# Patient Record
Sex: Male | Born: 1960
Health system: Southern US, Community
[De-identification: ages and names within clinical notes are randomized; demographics above are authoritative.]

## PROBLEM LIST (undated history)

## (undated) DIAGNOSIS — E785 Hyperlipidemia, unspecified: Secondary | ICD-10-CM

## (undated) DIAGNOSIS — G4730 Sleep apnea, unspecified: Secondary | ICD-10-CM

## (undated) DIAGNOSIS — D229 Melanocytic nevi, unspecified: Secondary | ICD-10-CM

## (undated) DIAGNOSIS — I10 Essential (primary) hypertension: Secondary | ICD-10-CM

## (undated) DIAGNOSIS — R0681 Apnea, not elsewhere classified: Secondary | ICD-10-CM

## (undated) HISTORY — DX: Apnea, not elsewhere classified: R06.81

## (undated) HISTORY — DX: Essential (primary) hypertension: I10

## (undated) HISTORY — DX: Hyperlipidemia, unspecified: E78.5

## (undated) HISTORY — DX: Sleep apnea, unspecified: G47.30

---
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
## 2000-07-27 ENCOUNTER — Encounter: Payer: Self-pay | Admitting: Emergency Medicine

## 2000-07-27 ENCOUNTER — Inpatient Hospital Stay (HOSPITAL_COMMUNITY): Admission: EM | Admit: 2000-07-27 | Discharge: 2000-07-28 | Payer: Self-pay | Admitting: Emergency Medicine

## 2000-07-28 ENCOUNTER — Encounter: Payer: Self-pay | Admitting: Internal Medicine

## 2000-08-27 ENCOUNTER — Ambulatory Visit (HOSPITAL_COMMUNITY): Admission: RE | Admit: 2000-08-27 | Discharge: 2000-08-27 | Payer: Self-pay | Admitting: Cardiology

## 2000-09-06 ENCOUNTER — Ambulatory Visit (HOSPITAL_COMMUNITY): Admission: RE | Admit: 2000-09-06 | Discharge: 2000-09-06 | Payer: Self-pay | Admitting: Internal Medicine

## 2002-09-14 ENCOUNTER — Encounter: Payer: Self-pay | Admitting: Specialist

## 2002-09-15 ENCOUNTER — Observation Stay (HOSPITAL_COMMUNITY): Admission: RE | Admit: 2002-09-15 | Discharge: 2002-09-16 | Payer: Self-pay | Admitting: Specialist

## 2003-03-02 ENCOUNTER — Ambulatory Visit (HOSPITAL_COMMUNITY): Admission: RE | Admit: 2003-03-02 | Discharge: 2003-03-03 | Payer: Self-pay | Admitting: Orthopedic Surgery

## 2003-03-02 ENCOUNTER — Encounter: Payer: Self-pay | Admitting: Orthopedic Surgery

## 2003-03-02 ENCOUNTER — Encounter (INDEPENDENT_AMBULATORY_CARE_PROVIDER_SITE_OTHER): Payer: Self-pay | Admitting: Specialist

## 2004-02-28 ENCOUNTER — Ambulatory Visit (HOSPITAL_COMMUNITY): Admission: RE | Admit: 2004-02-28 | Discharge: 2004-02-28 | Payer: Self-pay | Admitting: Orthopedic Surgery

## 2004-07-31 ENCOUNTER — Ambulatory Visit (HOSPITAL_BASED_OUTPATIENT_CLINIC_OR_DEPARTMENT_OTHER): Admission: RE | Admit: 2004-07-31 | Discharge: 2004-07-31 | Payer: Self-pay | Admitting: Orthopedic Surgery

## 2004-07-31 ENCOUNTER — Ambulatory Visit (HOSPITAL_COMMUNITY): Admission: RE | Admit: 2004-07-31 | Discharge: 2004-07-31 | Payer: Self-pay | Admitting: Orthopedic Surgery

## 2004-12-15 ENCOUNTER — Encounter: Admission: RE | Admit: 2004-12-15 | Discharge: 2004-12-15 | Payer: Self-pay | Admitting: Internal Medicine

## 2004-12-15 IMAGING — US US SCROTUM
1 series · 14 of 25 positions shown · non-contrast
Comparison: None.

CLINICAL DATA: Left testicle larger than the right on physical exam. 
SCROTAL ULTRASOUND ? [DATE]:

[Series 1: unknown · 0.09mm/px · 14 of 66 slices shown]
[im 1/66]
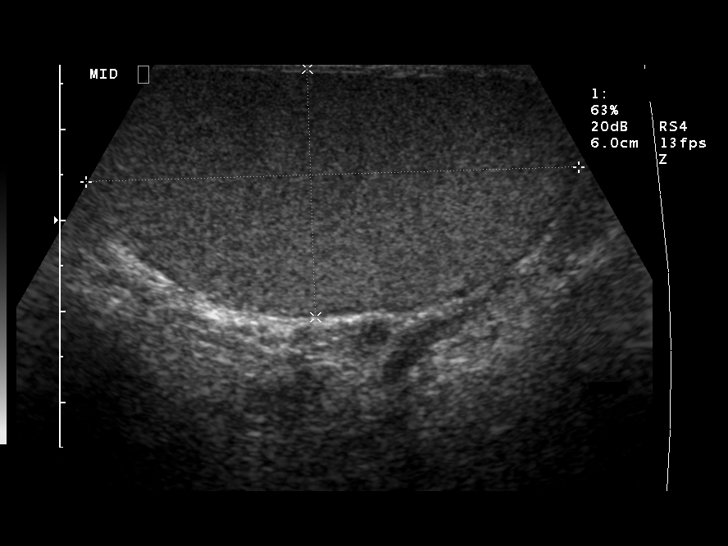
[im 6/66]
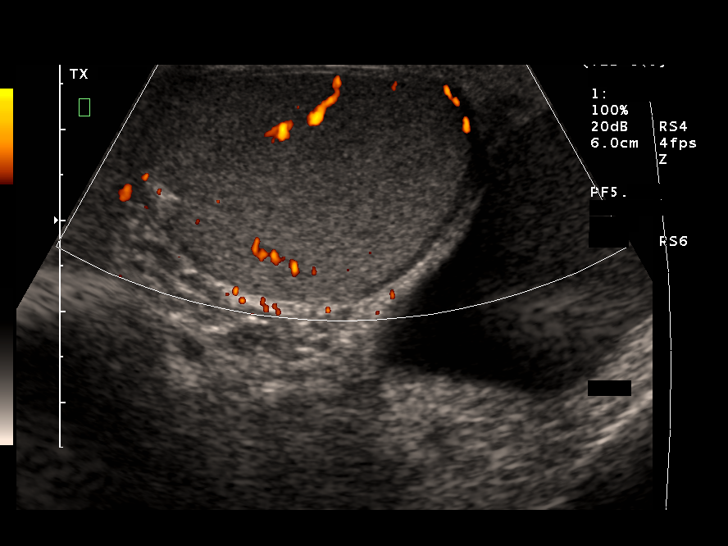
[im 11/66]
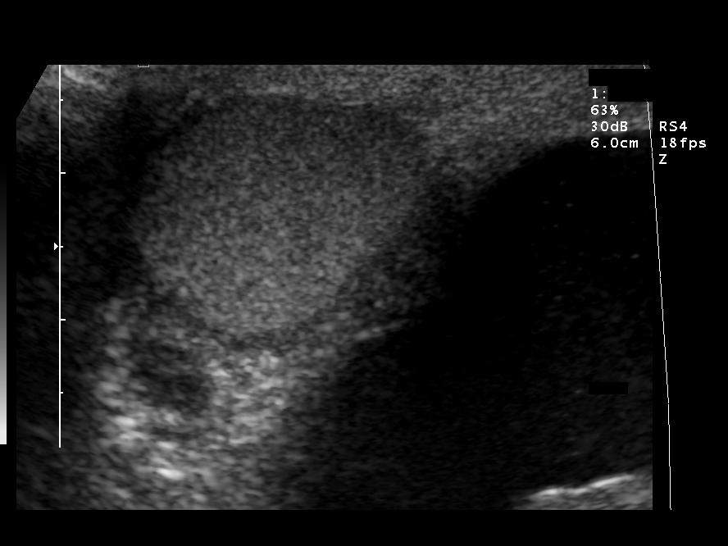
[im 17/66]
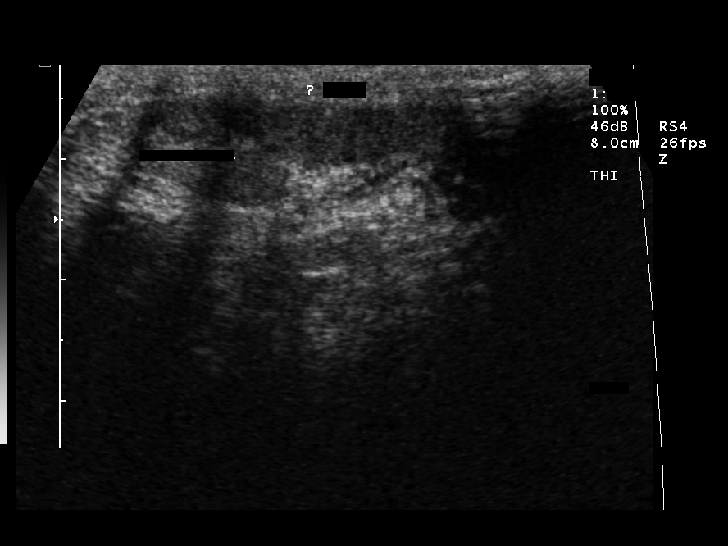
[im 22/66]
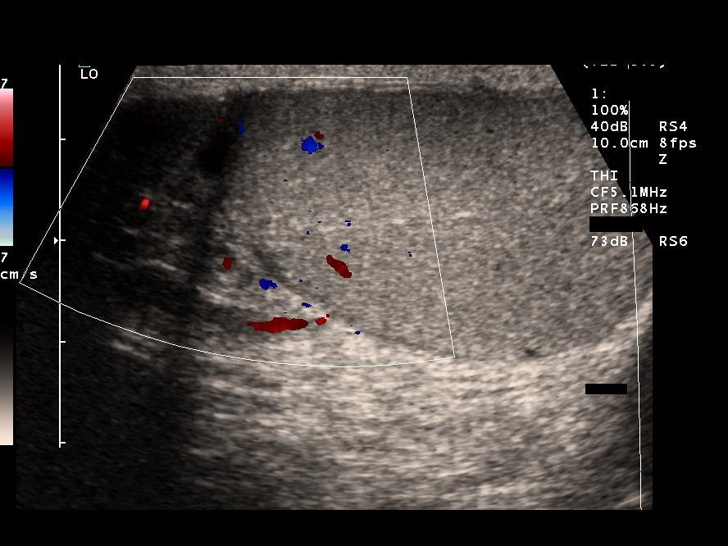
[im 25/66]
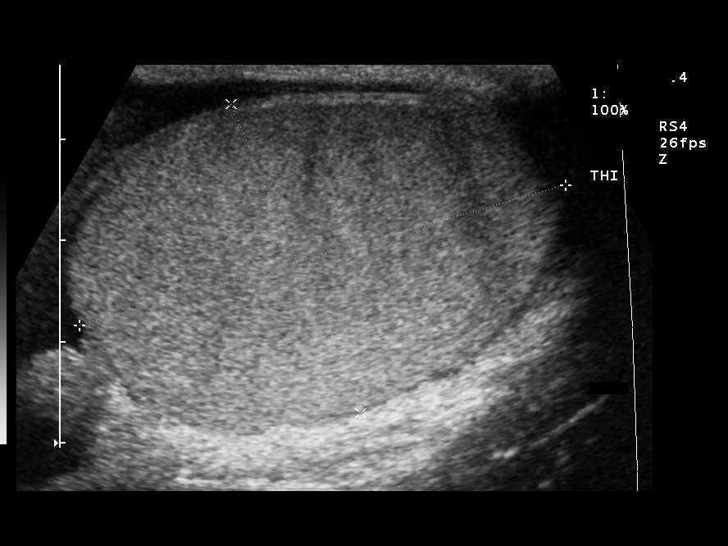
[im 30/66]
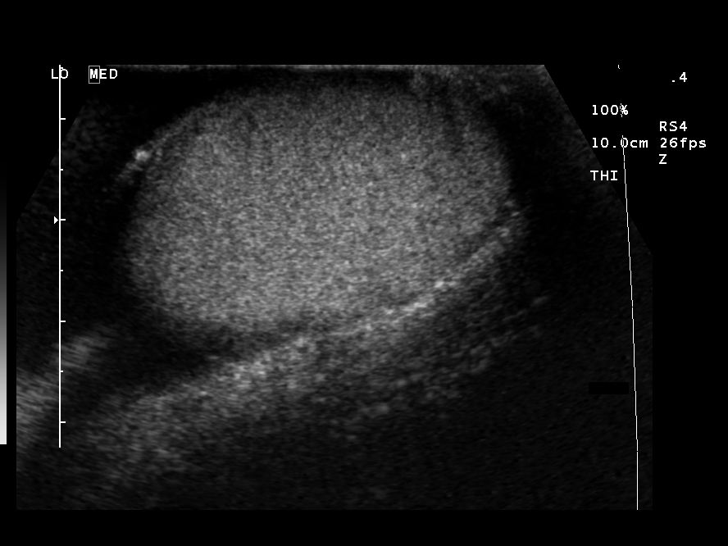
[im 36/66]
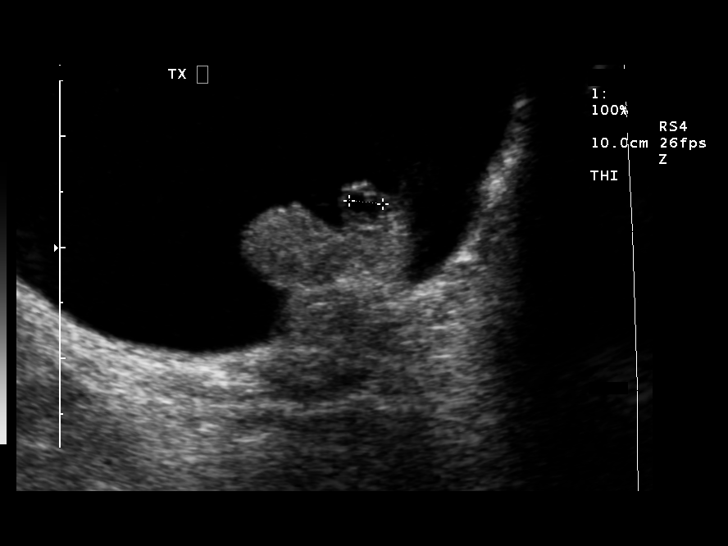
[im 41/66]
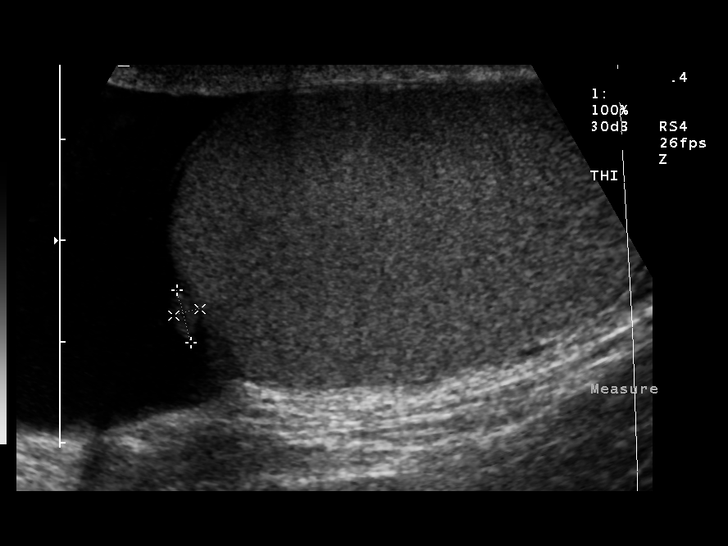
[im 44/66]
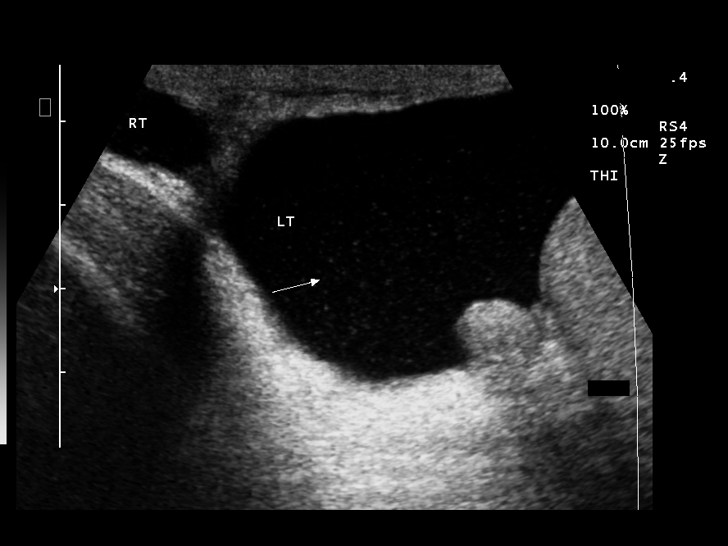
[im 49/66]
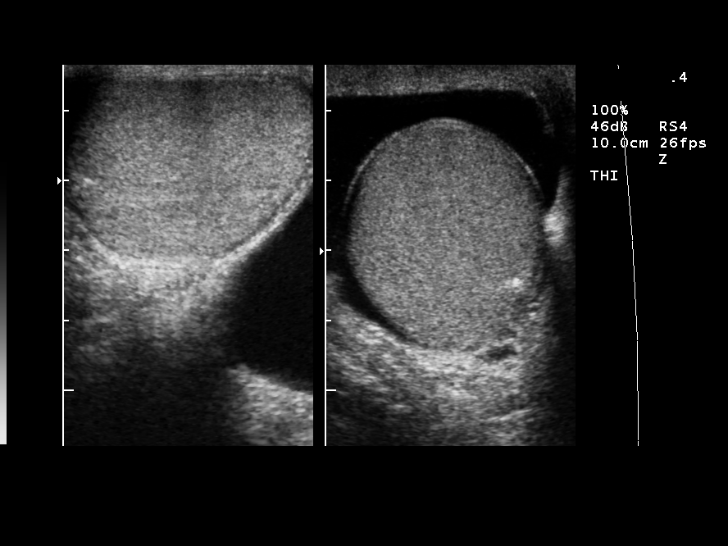
[im 55/66]
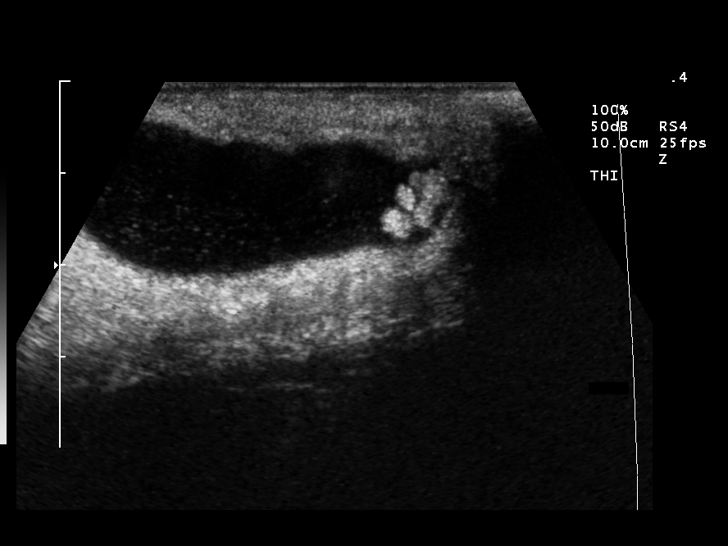
[im 60/66]
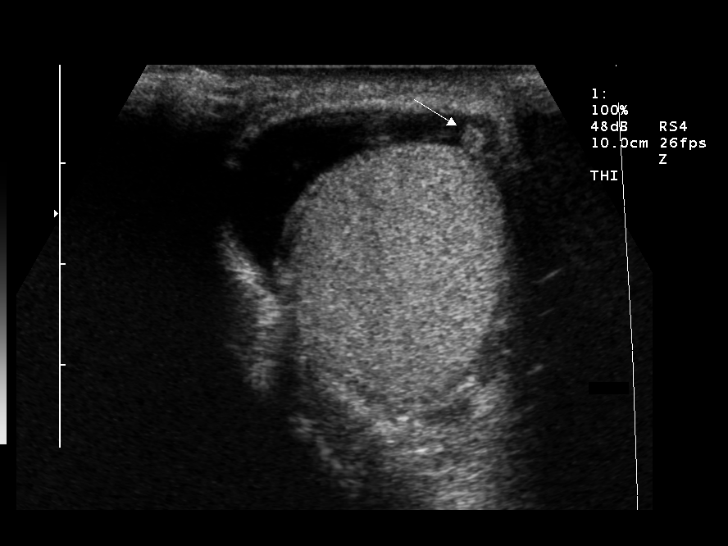
[im 66/66]
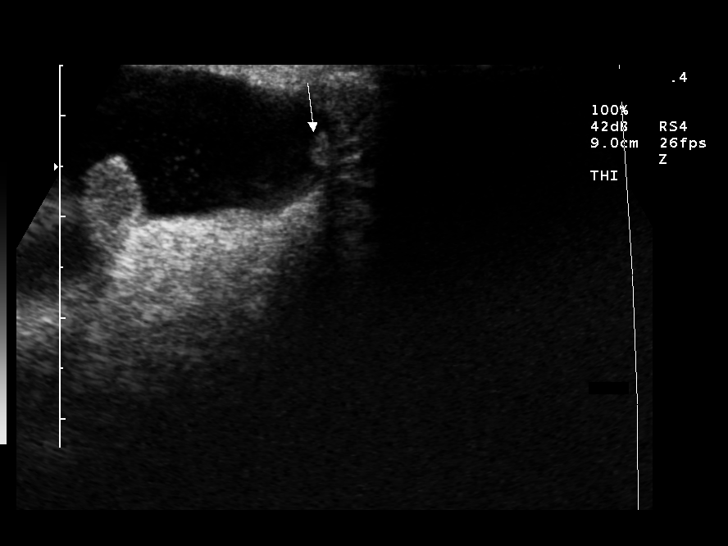

[14 of 25 positions shown; findings below may reference images not displayed]

The right testicle measures 5.4 x 2.7 x 3.7 cm.  The left testicle measures 5.0 x 3.2 x 2.9 cm.  Both have normal homogeneous echotexture.  No intratesticular mass is identified. 
There is a moderate left hydrocele with multiple left scrotal pearls identified within the fluid of the hydrocele.  Tiny epididymal cyst or spermatocele is seen in the left epididymis. The right epididymis is sonographically normal.  There is no evidence for a varicocele on either side.
IMPRESSION: 1.  Symmetric size of the testicles.
2.  Moderate left hydrocele with numerous scrotal pearls.  These small calcifications within the hydrocele are nonspecific but have been associated with repetitive trauma (as seen in mountain bikers, etc) or repeated infections/inflammatory process.

## 2006-01-01 ENCOUNTER — Encounter: Admission: RE | Admit: 2006-01-01 | Discharge: 2006-01-01 | Payer: Self-pay | Admitting: Specialist

## 2006-02-15 ENCOUNTER — Inpatient Hospital Stay (HOSPITAL_COMMUNITY): Admission: RE | Admit: 2006-02-15 | Discharge: 2006-02-17 | Payer: Self-pay | Admitting: Neurosurgery

## 2007-01-25 ENCOUNTER — Ambulatory Visit (HOSPITAL_COMMUNITY): Admission: RE | Admit: 2007-01-25 | Discharge: 2007-01-25 | Payer: Self-pay | Admitting: Cardiology

## 2007-02-15 ENCOUNTER — Ambulatory Visit: Payer: Self-pay | Admitting: Internal Medicine

## 2007-06-13 ENCOUNTER — Encounter: Admission: RE | Admit: 2007-06-13 | Discharge: 2007-06-13 | Payer: Self-pay | Admitting: Orthopedic Surgery

## 2007-06-24 DIAGNOSIS — D229 Melanocytic nevi, unspecified: Secondary | ICD-10-CM

## 2007-06-24 HISTORY — DX: Melanocytic nevi, unspecified: D22.9

## 2007-08-31 ENCOUNTER — Encounter: Admission: RE | Admit: 2007-08-31 | Discharge: 2007-08-31 | Payer: Self-pay | Admitting: Neurosurgery

## 2007-09-06 ENCOUNTER — Encounter: Admission: RE | Admit: 2007-09-06 | Discharge: 2007-09-06 | Payer: Self-pay | Admitting: Neurosurgery

## 2007-12-12 ENCOUNTER — Encounter: Admission: RE | Admit: 2007-12-12 | Discharge: 2007-12-12 | Payer: Self-pay | Admitting: Neurosurgery

## 2008-02-13 ENCOUNTER — Inpatient Hospital Stay (HOSPITAL_COMMUNITY): Admission: RE | Admit: 2008-02-13 | Discharge: 2008-02-15 | Payer: Self-pay | Admitting: Neurosurgery

## 2008-02-24 ENCOUNTER — Inpatient Hospital Stay (HOSPITAL_COMMUNITY): Admission: RE | Admit: 2008-02-24 | Discharge: 2008-02-26 | Payer: Self-pay | Admitting: Neurological Surgery

## 2008-04-04 ENCOUNTER — Encounter: Admission: RE | Admit: 2008-04-04 | Discharge: 2008-04-04 | Payer: Self-pay | Admitting: Neurosurgery

## 2008-05-08 ENCOUNTER — Encounter: Admission: RE | Admit: 2008-05-08 | Discharge: 2008-05-08 | Payer: Self-pay | Admitting: Neurosurgery

## 2009-12-11 ENCOUNTER — Encounter: Admission: RE | Admit: 2009-12-11 | Discharge: 2009-12-11 | Payer: Self-pay | Admitting: Internal Medicine

## 2010-11-18 NOTE — Assessment & Plan Note (Signed)
[facility]                             PULMONARY OFFICE NOTE   CASTIN, DONAGHUE                     MRN:          [id number]  DATE:02/15/2007                            DOB:          Mar 17, 1961    HISTORY:  This is a very nice 50 year old white male, never smoker, who  states he has had allergies for years, and also sleep apnea for which  he is on a CPAP mask, but enjoys fairly good health.  Interestingly, he  underwent evaluation for atypical chest pain that lasted several months  several years ago.  He was found to have a negative left heart  catheterization then, and returns now for virtually the same symptoms  for the last several months.  He has reproducible dyspnea with exertion  associated with chest tightness.  It occasionally occurs sitting, but  almost always occurs  with exertion.  It never occurs sleeping, and is  not associated with any significant cough, increased itching, sneezing,  or subjective wheezing, overt reflux symptoms, or sinus symptoms.   PAST MEDICAL HISTORY:  Significant for moderate obesity with  hypertension and previous left heart catheterization (he tells me he did  not undergo any intervention in 2002, but after the heart  catheterization this pain went away).   ALLERGIES:  NONE KNOWN.   SOCIAL HISTORY:  He has never smoked.  Works as an Air[employer] pilot.   FAMILY HISTORY:  Recorded in detail on the worksheet, and significant  for the fact that his father had heart disease, and his mother had  allergies, but no asthma.   REVIEW OF SYSTEMS:  Also taken in detail on the worksheet, and negative  except as outlined above.   PHYSICAL EXAMINATION:  This is a slightly anxious, but very pleasant  ambulatory white male in no acute distress.  He is afebrile with stable vital signs.  HEENT:  Reveals mild turbinate edema with no cyanosis, pallor, or  polyps.  Ear canals clear bilaterally.  Oropharynx clear.  Dentition  intact.  NECK:  Without cervical adenopathy or tenderness.  Trachea is midline.  No thyromegaly.  LUNG FIELDS:  Perfectly clear bilaterally to auscultation and  percussion.  HEART:  Appears regular rhythm without murmur, gallop, or rub.  ABDOMEN:  Soft and benign.  EXTREMITIES:  Warm without calf tenderness, cyanosis, clubbing, or  edema.   Hemoglobin saturation at rest was 98% on room air.  PFTs were reviewed  from [facility] dated July 22 indicating expiratory truncation on the  effort dependent portion only with FEV1 of 113% predicted, and a normal  diffusing capacity.   IMPRESSION:  Atypical chest pain, new onset dyspnea with exertion  associated with expiratory truncation may be functional or may be  related to reflux.  Occult thromboembolic disease is very unlikely with  a normal diffusing capacity, as is occult interstitial lung disease.  The reproducibility of the discomfort with exertion could raise the  possibility of pulmonary hypertension, but I understand a 2D echo was  also unremarkable per Dr. Amil Amen' notes.   My recommendation is an empiric trial of Prevacid  30-mg tablets 30  minutes before meals perfectly regularly for the next 4 weeks, along  with a diet, and emphasized the avoidance of menthol lozenges and a 6-  day course of prednisone in case there is any asthma or allergy present  that may have contributed to his upper airway symptoms.  If he improves  after 6 days of prednisone, then relapses while still taking PPI, it  would indicate more of a primary allergic/asthmatic component than is  obvious by today's evaluation.     Charlaine Dalton. Sherene Sires, MD, [facility]  Electronically Signed    MBW/MedQ  DD: 02/15/2007  DT: 02/16/2007  Job #: [id number]   cc:   Francisca December, M.D.  Georgann Housekeeper, MD

## 2010-11-18 NOTE — Op Note (Signed)
NAMEKOHL, POLINSKY              ACCOUNT NO.:  [id number]   MEDICAL RECORD NO.:  [id number]          PATIENT TYPE:  INP   LOCATION:  3013                         FACILITY:  [facility]   PHYSICIAN:  Tia Alert, MD     DATE OF BIRTH:  Feb 10, 1961   DATE OF PROCEDURE:  02/24/2008  DATE OF DISCHARGE:                               OPERATIVE REPORT   PREOPERATIVE DIAGNOSIS:  Lumbar wound hematoma status post posterior  lumbar interbody fusion.   POSTOPERATIVE DIAGNOSIS:  Lumbar wound hematoma status post posterior  lumbar interbody fusion.   PROCEDURE:  Irrigation and debridement of lumbar wound with evacuation  of postoperative hematoma.   SURGEON:  Tia Alert, MD   ANESTHESIA:  General endotracheal.   COMPLICATIONS:  None apparent.   INDICATIONS FOR PROCEDURE:  Mr. Soule is a 50 year old gentleman who  came in today with continued swelling of his lumbar wound with pain in  his legs and some drainage from his wound.  I recommended lumbar  exploration for evacuation of hematoma and to rule out infection.  He is  a patient of Dr. Sherian Maroon who underwent surgery 11 days ago in the form  of posterior lumbar interbody fusion.  He understood the risks,  benefits, expected outcome and wished to proceed.   DESCRIPTION OF THE PROCEDURE:  The patient was taken to the operating  room and after induction of adequate generalized endotracheal  anesthesia, he was rolled into the prone position on the Wilson frame  and all pressure points were padded.  His lumbar region was prepped with  DuraPrep and then draped in the usual sterile fashion, 5 mL of local  anesthesia was injected and his old incision was opened and once the  subcutaneous tissues were opened, there was instant release of  serosanguineous fluid.  There was no gross evidence of infection or  exudate.  The tissues were debrided.  I opened up the fascia and the  muscle.  I  removed Gelfoam from the top of the dura, found no  evidence  of CSF leak.  The hardware was tight and firm.  I again debrided the  tissues until I had nice bleeding tissues.  I irrigated then with saline  solution containing bacitracin.  I then placed 2 medium Hemovac drains  through separate stab incisions, dried all bleeding points, and then  closed the muscle and the fascia with 0 Vicryl.  Closed the subcutaneous  and subcuticular tissue with 2-0 and 3-0 Vicryl, and closed the skin  with Benzoin and Steri-Strips.  Drapes removed.  A sterile dressing was  applied.  The patient was awakened from general anesthesia and  transferred to recovery room in stable condition.  At the end of the  procedure, all sponge, needle, and instrument counts were correct.      Tia Alert, MD  Electronically Signed    DSJ/MEDQ  D:  02/24/2008  T:  02/25/2008  Job:  [id number]

## 2010-11-18 NOTE — Op Note (Signed)
Steven Edwards, Steven Edwards              ACCOUNT NO.:  [id number]   MEDICAL RECORD NO.:  [id number]          PATIENT TYPE:  INP   LOCATION:  2899                         FACILITY:  [facility]   PHYSICIAN:  Kathaleen Maser. Pool, M.D.    DATE OF BIRTH:  1960/10/15   DATE OF PROCEDURE:  02/13/2008  DATE OF DISCHARGE:                               OPERATIVE REPORT   PREOPERATIVE DIAGNOSIS:  L4-L5 degenerative disk disease with stenosis  and instability, status post L5-S1 decompression and fusion with  instrumentation.   POSTOPERATIVE DIAGNOSIS:  L4-L5 degenerative disk disease with stenosis  and instability, status post L5-S1 decompression and fusion with  instrumentation.   PROCEDURE:  1. Re-exploration of L4-L5 laminectomy with complete bilateral L4 and      L5 decompressive laminectomies and foraminotomy, more than would be      required for simple interbody fusion alone.  2. L4-L5 posterior lumbar interbody fusion utilizing tangent interbody      allograft wedge, Telamon interbody PEEK cage and local      autografting.  3. L4-L5 posterolateral arthrodesis utilizing nonsegmental pedicle      screw fixation and local autografting.  4. Re-exploration of L5-S1 fusion with removal of hardware.   SURGEON:  Kathaleen Maser. Pool, MD   ASSISTANT:  Reinaldo Meeker, MD   ANESTHESIA:  General endotracheal.   INDICATIONS:  [name] is a 50 year old male status post L5-S1  decompression and fusion for treatment of a lytic spondylolisthesis at  L5-S1.  Postoperatively, the patient did well but has had trouble with  recurrent lower extremity pain and back pain consistent with stenosis of  the L4-L5 level.  Workup demonstrates evidence of segmental breakdown at  the level of previous fusion.  Workup demonstrates solid fusion at L5-  S1.  The patient presents now for L4-L5 decompression and fusion with  instrumentation after failing conservative management.   OPERATIVE NOTE:  The patient was taken to the  operating room and placed  on the operating table in supine position.  After adequate level of  anesthesia was achieved, the patient placed prone on Wilson frame,  appropriately padded, the patient's lumbar regions were prepped and  draped sterilely.  A 10-blade was used to make a curvilinear skin  incision overlying the L4-L5 and S1 levels.  This was carried down  sharply in midline.  Subperiosteal dissection then performed exposing  the lamina facet joints of L3 and L4 as well as the transverse processes  of L4-L5 and the sacral ala.  The patient's previously placed pedicle  screw station was dissected free at L5-S1.  The pedicle screw fixation  was disassembled and the fusion was inspected.  The fusion was found to  be solid.  Pedicle screws at S1 were removed bilaterally.  A pedicle  screw on the right-side at L5 was redirected more medially.  Attention  was then placed at the L4-L5 level.  Complete decompressive laminectomy  was then performed at L4-L5 by removing the entire lamina of L4, entire  inferior facet of L4 bilaterally, superior facet of L5 bilaterally and  residual epidural scar and  ligamentum flavum were resected.  Wide  decompressive foraminotomies were then performed along the course of  exiting L4 and L5 nerve roots bilaterally.  Epidural venous plexus was  coagulated and cut.  Starting first, the patient's left side thecal sac  and nerve root was gently mobilized and tracked towards the midline.  Disk space was then incised with a 15 blade in a rectangular fashion.  A  wide disk space clean-out was then achieved using pituitary rongeurs,  upbiting pituitary rongeurs and Epstein curettes.  The procedure then  proceeded on the contralateral side and 10 mm distractor was left at the  patient's right side.  Thecal sac and nerve was inspected on the left  side.  Disk space was then reamed and cut with 10 mm tangent instrument.  Soft tissues then removed from the interspace.   A 10 x 26 mm tangent  wedge was then packed into place and recessed approximately 2 mm from  the posterior cortical margin of L4.  Distractors were on the patient's  right side.  Thecal sac and nerve root was checked on the right side.  Disk space once again reamed and then cut with a 10 mL tangent  instrument.  Soft tissues then removed from the interspace.  Disk space  was further curettaged with morselized autograft mixed with Progenix  putty was then packed into interspace.  A 10 x 22 mm Telamon cage packed  with morselized autograft and Progenix putty was then packed into place  and recessed approximately 2 mm from the posterior cortical margin of  L4.  Pedicles of L4 were then identified using surface landmarks and  intraoperative fluoroscopy.  Superficial bone was then removed overlying  the pedicles.  Each pedicle was then probed using pedicle awl.  Each  pedicle awl track was then tapped with 5.25 mm screw tapper.  Each screw  tap hole was probed and found to be solid bone.  A 6.75 x 14 mm radius  screws were placed bilaterally at L4.  Transverse processes of L4 and L5  were then decorticated using high speed drill.  Morselized autograft was  packed posterolaterally for later fusion.  Short segment titanium rod  was then cut and placed over the screw heads at L4 and L5.  The locking  caps were placed over screw.  The locking caps were then engaged with a  construct under compression.  Final images revealed good position of the  bone grafts and hardware with normal alignment of the spine.  Wound was  then irrigated with antibiotic solution.  Gelfoam was placed topically  for hemostasis and found to be good.  A medium Hemovac drain was left in  interspace.  The wound was then closed in layers with Vicryl sutures.  Steri-Strips and sterile dressing were applied.  The were no  complications.  The patient tolerated the procedure well and he returned  to recovery room  postoperatively.           ______________________________  Kathaleen Maser Pool, M.D.     HAP/MEDQ  D:  02/13/2008  T:  02/14/2008  Job:  [id number]

## 2010-11-21 NOTE — Op Note (Signed)
[name], [name]                          ACCOUNT NO.:  [id number]   MEDICAL RECORD NO.:  [id number]                   PATIENT TYPE:  OIB   LOCATION:  2550                                 FACILITY:  [facility]   PHYSICIAN:  Steven Edwards., M.D.          DATE OF BIRTH:  09/02/1960   DATE OF PROCEDURE:  02/28/2004  DATE OF DISCHARGE:  02/28/2004                                 OPERATIVE REPORT   PREOPERATIVE DIAGNOSES:  1. Painful left shoulder status post prior arthroscopic evaluation with     subacromial decompression and limited acromioplasty with coracoacromial     ligament release with clinical evidence of adhesive capsulitis, rule out     intra-articular pathology.  2. Status post excision of left volar ganglion first dorsal compartment     release and synovectomy of radiocarpal articulation by Steven Edwards,     M.D. with recurrence of volar ganglion and persistent pain.  An MRI prior     to surgery documented a recurrent volar ganglion adjacent to the     radioscaphocapitate ligament deep to the radial artery.   POSTOPERATIVE DIAGNOSIS:  1. Painful left shoulder status post prior arthroscopic evaluation with     subacromial decompression and limited acromioplasty with coracoacromial     ligament release with clinical evidence of adhesive capsulitis, rule out     intra-articular pathology.  2. Status post excision of left volar ganglion first dorsal compartment     release and synovectomy of radiocarpal articulation by Steven Edwards,     M.D. with recurrence of volar ganglion and persistent pain.  An MRI prior     to surgery documented a recurrent volar ganglion adjacent to the     radioscaphocapitate ligament deep to the radial artery.  Identification     of full range of motion of the shoulder under interscalene     anesthesia/general anesthesia with a slight crepitation noted at 90     degrees elevation and external rotation of 80 to 85 degrees without signs    of gross instability.  3. Significant volar ganglion with mucinous degeneration of volar     radiocarpal ligaments and patulous probable posttraumatic changes in     palmar aspect of scapholunate interosseous ligament identified by     radiocarpal arthrotomy.   OPERATION PERFORMED:  1. Examination of left shoulder under anesthesia.  2. Exploration of left volar wrist with resection of prior surgical scar and     resection of recurrent volar ganglion followed by  3. Wrist arthrotomy for evaluation of scapholunate interosseous ligament     with debridement of patulous ligament/origin of ganglion followed by     repair of volar radiocarpal ligaments.   SURGEON:  Steven Edwards, M.D.   ASSISTANT:  Steven Edwards, P.A.   ANESTHESIA:  General endotracheal supplemented by interscalene block for  postoperative shoulder analgesia.   SUPERVISING ANESTHESIOLOGIST:  Steven Edwards, M.D.  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  He was referred for evaluation  and management of chronic left wrist pain and left shoulder pain.  He had  prior treatment at [facility] consisting of an evaluation  of his left shoulder, clinical examination and MRI followed by diagnostic  arthroscopy and subacromial decompression.  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  His  shoulder was addressed by Steven Edwards, M.D. with diagnostic arthroscopy,  bursectomy and subacromial decompression with identification of no  significant intra-articular pathology.  His wrist pathology was addressed by  Steven Edwards, M.D. with exploration of the volar aspect of the wrist,  release of the first dorsal compartment, excision of a volar ganglion and  synovectomy of the wrist joint.  Following his  surgeries, he had some  improvement in his shoulder for a period of time but then developed a loss  of range of motion and some chronic mild shoulder pain.  He had difficulty  sleeping on his shoulder and some pain with particular positions of shoulder  function.  He noted a fullness on the volar aspect of his wrist and had  recurrent pain aggravated by wrist extension and certain lifting activities  with his hands.  He sought an alternative orthopedic consultation in the  spring of 2005.  Clinical examination at that time revealed restriction of  range of motion of his left shoulder with elevation limited to approximately  150 degrees, external rotation limited at 70 degrees and internal rotation  limited to approximately L3 versus full motion on the right.  Clinically, he  appeared to have adhesive capsulitis.  His wrist exam revealed a healed scar  with tenderness on palpation of the volar wrist capsule.  My suspicion was  that he had a recurrent volar ganglion that was deep to the radial artery  and perhaps intracapsular.  He was referred for an MRI of his wrist which  confirmed a recurrent volar ganglion deep to the radial artery and some  abnormal signal in the region of the volar radiocarpal ligaments.   We advised him to consider possible re-exploration of the wrist.  He  requested that we also consider re-evaluation of his shoulder.  He was sent  for follow-up MRI of his shoulder in the first week of August 2005.  This  was interpreted by the radiologist at [facility] to reveal only minor  degenerative change at the [facility] joint and no sign of rotator cuff tear or  significant labral pathology. The biceps tendon was intact and the rotator  cuff appeared to be entirely intact.  After informed consent, he is brought  to the operating room at this time anticipating examination of his left  shoulder under anesthesia with possible arthroscopy to follow if we find signs of instability or  impingement.  Our goal is to increase his range of  motion to full range of motion through gentle manipulation.  Preoperatively,  after consultation with Dr. Gypsy Balsam, he elected to proceed with an  interscalene block for postoperative analgesia following the shoulder  evaluation/manipulation and possible arthroscopy.  General anesthesia by  endotracheal technique was advised for his volar wrist pathology.   DESCRIPTION OF PROCEDURE:  Steven Edwards was brought to the operating room and  placed in supine position on the  operating table.  Following interscalene  block in the holding area, anesthesia was complete in the right forequarter.  General orotracheal anesthesia was induced followed by careful positioning  of the left arm, on a pair of padded arm boards.  The left arm was carefully  examined under anesthesia, i.e. combined anesthesia of an interscalene block  providing full muscle relaxation and general endotracheal anesthesia. Mr.  Edwards was noted to have elevation of his shoulder to 180 degrees, external  rotation at 90 degrees abduction of 95 degrees and internal rotation of at  least 80 degrees at 90 degrees abduction.  He had a slight clunk with  movement of his shoulder in external rotation from 80 to 85 degrees at 90  degrees abduction.  This disappeared with elevation of his shoulder above  100 degrees and was not present at full elevation or degrees of elevation  below 80 degrees in external rotation.  This led me to believe that he  probably had a slight instability of his glenohumeral joint that was  probably not clinically significant with normal muscle tone.  I could not  identify any substantial anterior posterior instability pattern with careful  examination of the glenohumeral capsular ligaments.  In my judgment  diagnostic arthroscopy in view of a basically normal MRI was not warranted.   Attention was then directed to the distal wrist predicament.  A pneumatic  tourniquet  was applied to the proximal brachium followed by routine Betadine  prep with scrub and paint.  Sterile arthroscopy stockinette and drapes were  applied.  The left arm was exsanguinated with an Esmarch bandage and the  arterial tourniquet on the proximal brachium inflated to 225 mmHg.  The  procedure commenced with a resection of the prior Brunner zigzag incision  scar.  Subcutaneous tissues were very meticulously dissected identifying the  radial sensory branches and the radial artery proximal to the region of  prior surgery.  The radial artery and its accompanying veins were dissected  from the surgical scar and the radial artery was mobilized with a vessel  loop in a radial direction.  The volar capsular ligaments were tented over  an apparent mass.  The mass was circumferentially dissected and found to be  a ganglion that appeared to be forming within the volar radial carpal  ligaments and involving them directly.  There appeared to be a cystic degeneration of the ligaments proper primarily the interval between the  radioscaphocapitate and radioscapholunate ligaments.  The interval was  developed and the entire ganglion resected.  This followed directly to the  volar aspect of the scapholunate interosseous ligament.  The ligament was  noted to be quite patulous and appeared to have some posttraumatic laxity.  The patulous portion of the ligament was resected with a rongeur and the  root of the ganglion resected with a rongeur.  Great care was taken to  explore the wound for other extensions to the ganglion.  No other  predicaments were identified.  Bleeding points were electrocauterized with  bipolar current followed by a thorough irrigation of the wound.  The  interval between the radioscaphocapitate and the radioscapholunate ligaments  was then repaired with a series of figure-of-eight sutures of 4-0 Vicryl.  This led to a water tight seal of the volar aspect of the joint.  The radial   artery and its accompanying veins were replaced in anatomic position and the  skin repaired with subdermal sutures of 4-0 Vicryl and intradermal 3-0  Prolene with  Steri-Strips.  There were no apparent complications. The  tourniquet was released with immediate capillary refill to the hand.  A  compressive dressing was applied to the wrist with a volar plaster splint  maintaining the wrist in 5 degrees of dorsiflexion.  There were no apparent  complications.  My final diagnosis is a recurrent left volar ganglion with  cystic degeneration of the volar radiocarpal ligaments and perhaps an origin  off the scapholunate interosseous ligament.  The adhesive capsulitis of the  shoulder appeared to be perhaps due to altered muscle tone perhaps due to  pain.  There did not appear to be a strict capsular contracture, nor did  there appear to be adequate pathology to warrant an invasive arthroscopy at  this time based on the appearance of the MRI and clinical examination.   For aftercare, Steven Edwards will be transferred to the recovery room for  observation of his vital signs.  We anticipate discharge home if he has  adequate supervision during the next 24 hours or if not, available, we will  observe him for 24 hours as an inpatient.                                               Steven Fitch Naaman Edwards., M.D.    RVS/MEDQ  D:  02/28/2004  T:  02/29/2004  Job:  [id number]

## 2010-11-21 NOTE — H&P (Signed)
[facility]  Patient:    Steven Edwards, Steven Edwards                       MRN: [id number] Adm. Date:  07/27/00 Attending:  Justine Null, M.D. [facility] Dictator:   Janora Norlander, A.N.P./C                         History and Physical  HISTORY OF PRESENT ILLNESS:  [name] is a 50 year old white male patient of Dr. Melvyn Novas, who experienced chest pain centralized over his left nipple and radiating down his left arm today.  The pain started at approximately 10 a.m. and at this point at 3 p.m. continues.  Pain has not dissipated.  He has had a history of left chest and arm pain in the past, but this time he reports, "pain was severe.  I got dizzy, short of breath, got sweaty."  He states that the shortness of breath resolved with "one good deep breath, but that deep breath worsens my chest pain."  Nothing improves the left pain.  Occasionally he states that he will rub it.  He states that he had a very, very bad day yesterday at work, but he slept well.  PAST MEDICAL HISTORY: 1. History of left chest and arm pain, status post negative stress test,    possibly year 2001.  Placed on Celebrex, but he never filled this script. 2. Hypertension x 10 years on Norvasc and Hyzaar. 3. Seasonal allergies.  Uses Claritin. 4. Sleep apnea, status post septal deviation repair, now has C-PAP at home    which hs uses "religiously." 5. Reported increased cholesterol levels last year, level unknown to patient.    Lab records requested.  CURRENT MEDICATIONS: 1. Hyzaar 25/12.5 one pill p.o. q.d. 2. Norvasc 5 mg q.d. 3. Allegra, probably 60 mg per day.  Patient not sure. 4. Has taken one Zoloft in the past which his father sent, did not like    it.  Not repeated. 5. Wife reports very frequent use of Sudafed and Nyquil.  DIET:  High caffeine, high fat snacks.  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  He  works approximately 12 hours a day.  He is a nonsmoker and he drinks 1-2 beers on the weekends.  FAMILY HISTORY:  His father is a physician, home phone number [phone], work number [phone].  Father had an MI at age 80 and recently is being ruled out for diabetes mellitus.  Grandfather has diabetes and multi-infarct dementia.  Mother and siblings are healthy and noncontributory.  ALLERGIES:  SEASONAL.  No known drug or food allergies.  LABORATORY:  CBC essentially normal.  PT was 29.  All other labs pending.  Chest x-ray shows no apparent disease.  EKG pending.  PHYSICAL EXAMINATION:  VITAL SIGNS:  Blood pressure 145/95, pulse 111, respiratory rate 18, temperature 97.7.  HEENT:  Normocephalic, EOMI bilaterally.  NECK:  No adenopathy, no thyromegaly.  CHEST:  Normal rate and rhythm without murmur.  Pain with pressure over left pectoral.  LUNGS:  Clear to auscultation bilaterally.  EXTREMITIES:  No edema.  NEUROMUSCULAR:  Follows commands.  Tongue is midline.  Strength is 5/5 x 4. Reflexes are 1+ x 4.  ASSESSMENT:  Rule out chest pain.  PLAN:  1. Admit to Dr. Henderson Cloud service with [facility] on a telemetry unit.  2. One  aspirin p.o. q.d.  3. Heparin 5000 units subcu b.i.d.  4. O2 at 2 L.  5. C-PAP at night, 5 mm.  6. Norvasc 5 mg p.o. q.d.  7. Hyzaar 25/12.5 one q.d.  8. Diet -  No added salt.  9. Up and out of bed to bathroom with assistance.  CPK q.8h. x 3.  Any     laxative as needed p.r.n. 10. Allegra 60 mg p.o. b.i.d. 11. Halcion 0.25 to 0.5 mg p.o. q.h.s. p.r.n. sleep. 12. Xanax 0.5 to 1.0 mg p.o. q.6 p.r.n. anxiety. 13. Nitroglycerin 0.4 mg sublingual q.36m. p.r.n. protocol. 14. D5KVO morphine sulfate 2-10 mg IV each hour p.r.n. DD:  07/27/00 TD:  07/27/00 Job: [id number]

## 2010-11-21 NOTE — Discharge Summary (Signed)
[facility]  Patient:    Steven Edwards, Steven Edwards                       MRN: [id number] Adm. Date:  07/28/00 Disc. Date: 07/28/00 Attending:  Valetta Mole. Swords, M.D. [facility] CC:         Corwin Levins, M.D. [facility]   Discharge Summary  ADDENDUM:  CT scan of the chest was obtained.  There was a question of an infiltrate in the lingula.  This was conveyed to Mr. Barletta.  I will discharge him on Biaxin 500 mg p.o. b.i.d. in addition to the medications listed on the previous discharge summary.  Followup plans and outpatient tests will remain the same. DD:  07/28/00 TD:  07/28/00 Job: [id number]

## 2010-11-21 NOTE — Discharge Summary (Signed)
[facility]  Patient:    Steven Edwards, Steven Edwards                       MRN: [id number] Adm. Date:  07/27/00 Disc. Date: 07/28/00 Attending:  Valetta Mole. Swords, M.D. [facility] CC:         Corwin Levins, M.D. [facility]   Discharge Summary  DISCHARGE DIAGNOSES: 1. Chest pain syndrome. 2. Hypertension. 3. Seasonal allergies. 4. Sleep apnea. 5. Personal history of hyperlipidemia.  DISCHARGE MEDICATIONS: 1. Hyzaar 50/12.5 one p.o. q.d. 2. Norvasc 5 mg p.o. q.d. 3. Protonix 40 mg p.o. q.d.  CONDITION ON DISCHARGE:  Improved, chest pain resolved.  FOLLOWUP PLANS:  Cardiolite scheduled for July 30, 2000, at 9:15 in the morning.  Also followup with Dr. Jonny Ruiz in one to two weeks.  DISCHARGE LABORATORY DATA:  Sodium 137, potassium 3.9, chloride 103.  CKs remained normal.  Last CK 79, CK-MB 0.6, troponin I was normal at 0.01.  Chest x-ray demonstrated no active lung disease.  ECG demonstrated normal sinus rhythm as normal ECG.  HOSPITAL COURSE:  The patient was admitted to the hospital service on July 27, 2000.  See Dr. Henderson Cloud admission note.  The patient was continued on his medications.  Chest pain resolved spontaneously.  He had no recurrent chest pain.  Cardiolite was initially ordered, however, it was not able to be done on the day of discharge.  Since the patients pain has resolved, he had ruled out for a myocardial infarction, and we decided that it would be okay for him to leave.  A CAT scan of his chest to rule out a PE is pending at the time of discharge, and that will be called to me prior to discharge.  If the CAT scan is normal, he can leave the hospital safely. Recurrent symptoms were discussed with Mr. Borunda and he understands when to come back to the hospital.  This was also discussed with his father, Nigel Ericsson, MD.  They both understand the need to come back for recurrent symptoms. DD:  07/28/00 TD:  07/28/00 Job: [id number]

## 2010-11-21 NOTE — Op Note (Signed)
[name], [name]              ACCOUNT NO.:  [id number]   MEDICAL RECORD NO.:  [id number]          PATIENT TYPE:  AMB   LOCATION:  DSC                          FACILITY:  [facility]   PHYSICIAN:  Katy Fitch. Sypher Montez Hageman., M.D.DATE OF BIRTH:  [birth year]   DATE OF PROCEDURE:  07/31/2004  DATE OF DISCHARGE:                                 OPERATIVE REPORT   PREOPERATIVE DIAGNOSIS:  Chronic stage II impingement, right shoulder with  acromioclavicular arthropathy and chronic rotator cuff tendinopathy.  Rule  out adhesive capsulitis.   POSTOPERATIVE DIAGNOSIS:  No significant adhesive capsulitis with evidence  of documented acromioclavicular arthropathy and chronic anterior  supraspinatus hypertrophic bursitis and tendinopathy due to chronic stage II  impingement.   OPERATION:  1.  Examination of left shoulder under anesthesia, demonstrating impingement      beneath the AC joint.  2.  Arthroscopic examination of glenohumeral joint with identification of      intact hyaline articular cartilage surfaces and an intact rotator cuff      with intact biceps and labral complex.  3.  Subacromial decompression with bursectomy, coracoacromial ligament      release, and acromioplasty.  4.  Open resection of distal clavicle, left shoulder.   OPERATION SURGEON:  Lovey Newcomer, MD   ASSISTANT:  Marveen Reeks. Dasnoit, PA-C.   ANESTHESIA:  General by endotracheal technique.   SUPERVISING ANESTHESIOLOGIST:  Dr. Gelene Mink   INDICATIONS:  Steven Edwards is a 50 year old gentleman, who has had a  history of chronic left shoulder pain.  He was previously evaluated by Dr.  Paula Libra and underwent a left shoulder arthroscopy with acromioplasty.  His AC joint was not disturbed.   Postoperatively, he had a short period of adhesive capsulitis and  subsequently has had chronic pain.  He has had a painful bump in his  anterior supraspinatus tendon and a plain film exam and MRI exam that  documented AC prominence  with signs of chronic impingement of the anterior  supraspinatus.   Due to a failure to respond to nonoperative measures, he is brought to the  operating room at this time anticipating arthroscopic evaluation of the left  shoulder, subacromial decompression, and probable open distal clavicle  resection.   PROCEDURE:  [name] was brought to operating room and placed in  supine position on the operating table.  Following placement of an  interscalene block in the holding area by Dr. Gelene Mink, anesthesia was  satisfactory in the left arm and forequarter.   General orotracheal anesthesia was induced followed by careful positioning  in the beach-chair position with aid of a torso and head holder for his  operative shoulder arthroscopy.   The left arm and forequarter were prepped with DuraPrep and draped with  impervious arthroscopy drapes.  The shoulder was distended with 20 mL of  sterile saline brought in with a spinal needle anteriorly, followed by  placement of the arthroscope through a standard posterior portal with blunt  technique.  Diagnostic arthroscopy revealed intact hyaline articular  cartilage surfaces on the glenohumeral head.  Labral complex was intact, and  the biceps  origin was sound in the biceps tendon normal to the rotator  interval.  The supraspinatus, infraspinatus, teres minor, and subscapularis  were visualized and found to be normal.  The inferior and superior recesses  were normal.   The scope was removed from the glenohumeral joint and placed in the  subacromial space. A rather florid bursitis was noted.   After extensive bursectomy, the anatomy the coracoacromial arch was studied.  The anterior [facility] joint was quite prominent causing impingement against the  cuff.  The coracoacromial ligament had reformed, and it was quite  hypertrophic.  The coracoacromial ligament was released with electrocautery,  followed by leveling of the acromion to a type 1  morphology.  The capsule of  the [facility] joint was taken down and the prominent distal clavicle documented  with a digital camera.   A complete bursectomy was accomplished, followed by debridement of a  prominent bump on the anterior supraspinatus due to repetitive impingement  against the distal clavicle at approximately 90-100 degrees of elevation.   The scope was removed within the subacromial space, followed by resection of  the distal clavicle through a 2 cm incision directly over the [facility] joint.   The interval between the anterior third of the deltoid and trapezius muscles  was developed sharply, followed by subperiosteal dissection of the distal  clavicle.  The distal 15 mm of clavicle was removed with the oscillating  saw, and the fragment removed documented a 5 mm anterior osteophyte.   The space created by resection of the distal clavicle was closed with  mattress suture of #2 FiberWire, followed by repair of the subcutaneous  tissues with 3-0 Vicryl and intradermal 3-0 Prolene with a Steri-Strip.   The scope was re-placed in the subacromial space and the digital camera used  to document the subacromial decompression and distal clavicle resection.   There were no apparent complications.   [name] tolerated the surgery and anesthesia well.  He was transferred to  the recovery room with stable vital signs.   He will be admitted to the recovery care center due to a past history of  sleep apnea.  He is brought a C-PAP machine.   We anticipate IV antibiotics in the form of Ancef 1 gram IV q.8h. and use of  IV  and p.o. Dilaudid.    RVS/MEDQ  D:  07/31/2004  T:  07/31/2004  Job:  [id number]

## 2010-11-21 NOTE — Op Note (Signed)
NAMESEON, GAERTNER                          ACCOUNT NO.:  [id number]   MEDICAL RECORD NO.:  [id number]                   PATIENT TYPE:  OIB   LOCATION:  5021                                 FACILITY:  [facility]   PHYSICIAN:  Dionne Ano. Everlene Other, M.D.         DATE OF BIRTH:  07/30/1960   DATE OF PROCEDURE:  03/02/2003  DATE OF DISCHARGE:  03/03/2003                                 OPERATIVE REPORT   PREOPERATIVE DIAGNOSIS:  1. Left first dorsal compartment stenosing tenosynovitis.  2. Volar wrist mass, left upper extremity.   POSTOPERATIVE DIAGNOSIS:  1. Left first dorsal compartment stenosing tenosynovitis.  2. Volar wrist mass, left upper extremity.   OPERATION PERFORMED:  1. First dorsal compartment release, left wrist.  2. Deep mass removal, left wrist.  3. Arthrotomy with limited synovectomy, left wrist.   SURGEON:  Dionne Ano. Amanda Pea, M.D.   ASSISTANT:  Marshia Ly, P.A.   ANESTHESIA:  Infraclavicular block.   COMPLICATIONS:  None.   TOURNIQUET TIME:  Less than an hour.   ESTIMATED BLOOD LOSS:  Minimal.   DRAINS:  None.   INDICATIONS FOR PROCEDURE:  The patient is a very pleasant male who presents  with the above mentioned diagnosis after counseling regarding risks and  benefits of surgery including risks of infection, bleeding, anesthesia,  damage to normal structures and failure of surgery to accomplish its  intended goals of relieving symptoms and restoring function.  With this in  mind he desires to proceed.  All questions have been encouraged and answered  preoperatively.   DESCRIPTION OF PROCEDURE:  The patient was seen by myself and anesthesia. He  was taken to the operative suite and underwent appropriate padding and  prepped and draped in the usual sterile fashion about the left upper  extremity.  Infraclavicular block previously placed in the holding area by  anesthesia staff was in excellent working condition.  We kept his sedation  to a minimum  given his history of sleep apnea.  Following this, the patient  had a sterile field isolated.  The arm was elevated.  The tourniquet was  insufflated to 250 mmHg and an incision was made volar zigzag in nature over  the volar radial aspect of the wrist. Dissection was carried down. The  interval between the radial artery and the FCR was created and the FCR was  retracted ulnarly.  The radial artery and the main branch as well as  superficial branch, were isolated, identified and retracted out of harm's  way.  I carefully teased the deep branch of the radial artery away from the  mass and the mass was circumferentially identified and dissected to the  radiocarpal joint.  I removed the mass in its entirety.  At this point I  made an arthrotomy where the stalk was and performed a limited synovectomy.  Thus removal of the deep mass and an arthrotomy with synovectomy was  performed.  The patient tolerated this well without difficulty.  Following  this, the patient then had dissection carried out with the aid of retractors  to the first dorsal compartment and I released the first dorsal compartment  primarily dorsally, so as to prevent subluxing of the first dorsal  compartment tendons.  This was done to my satisfaction without difficulty.  There was full release accomplished.  I identified multiple slips of the APL  tendon and the EPB tendon.   Following this, the tourniquet was deflated.  Hemostasis obtained.  The  wounds were closed with interrupted Prolene without difficulty.  The patient  tolerated the procedure well.  There were no complicating features.  After  the wound was closed, sterile dressing was applied as well as a thumb spica  splint.  He was then taken to the recovery room and monitored. He will be  monitored in the recovery room and we will follow his progress along  accordingly. He will be spending the night due to his sleep apnea for  observation. I have discussed with him  and his family do's and don't's, etc.  and will proceed accordingly with his postoperative care.  All questions  have been encouraged and answered.                                                Dionne Ano. Everlene Other, M.D.    Nash Mantis  D:  03/04/2003  T:  03/05/2003  Job:  [id number]

## 2010-11-21 NOTE — Op Note (Signed)
Steven Edwards, MALECHA              ACCOUNT NO.:  [id number]   MEDICAL RECORD NO.:  [id number]          PATIENT TYPE:  INP   LOCATION:  2899                         FACILITY:  [facility]   PHYSICIAN:  Kathaleen Maser. Pool, M.D.    DATE OF BIRTH:  July 16, 1960   DATE OF PROCEDURE:  02/15/2006  DATE OF DISCHARGE:                                 OPERATIVE REPORT   PREOPERATIVE DIAGNOSIS:  L5-S1 degenerative disease with stenosis.   POSTOPERATIVE DIAGNOSIS:  L5-S1 degenerative disease with stenosis.   PROCEDURE NAME:  L5-S1 decompressive laminectomy with foraminotomies, more  than would be required for simple interbody fusion alone.  L5-S1 posterior  lumbar interbody fusion, utilizing tangent interbody allograft wedge,  Telamon interbody PEEK cage and local autografting.  L5-S1 posterolateral  arthrodesis utilizing nonsegmental pedicle screw instrumentation and local  autografting.   SURGEON:  Kathaleen Maser. Pool, M.D.   ASSISTANT:  Donalee Citrin, M.D.   ANESTHESIA:  General.   INDICATIONS:  Mr. Kelsay is a 50 year old male with history of severe back  and bilateral lower extremity pain right greater than left, failing all  conservative management.  Workup demonstrates evidence of severe disk  degeneration with foraminal stenosis bilaterally at L5-S1.  The patient  counseled as to his options.  He decided proceed with L5-S1 decompression  and fusion in hopes of improving his symptoms.   OPERATIVE NOTE:  The patient taken to operating room, placed on table in the  supine position, after adequate level of anesthesia was achieved, the  patient positioned prone onto Wilson frame, appropriately padded.  Lumbar  region was prepped and draped sterilely.  10 blade was used to make a linear  incision overlying L5-S1 interspace.  This carried down sharply in midline.  Subperiosteal dissection then performed exposing the lamina and facet joints  of L5 and S1.  Deep self retraining retractor was placed.   Intraoperative  fluoroscopy was taken and level was confirmed.  The laminectomy then  performed using Leksell rongeurs, Kerrison rongeurs, high-speed drill to  remove the entire lamina of L5 in the superior aspect along the lamina of  S1.  Inferior facetectomies of L5 were performed bilaterally as were  superior facetectomies of S1.  Ligament flavum was elevated resected  piecemeal fashion using Kerrison rongeurs for wide decompressive  foraminotomies then performed along the course exiting L5 and S1 nerve roots  bilaterally.  Epidural venous plexus coagulated cut.  Starting first  patient's right side, thecal sac and nerve roots protected on the right  side.  Disk space then incised 15 blade.  Disk space was then cleaned out  using pituitary rongeurs.  Disk was then sequentially dilated up to 8 mm  with a 8 mm distractor left in place.  Disk space was then exposed on the  left side with a nerve roots protected.  Once again diskectomy was performed  on the left side without complication.  Disk space was then reamed and then  cut with 8 mm tangent instruments.  Soft tissue removed from the interspace.  An 8 x 26 mmTelamoncage packed with morselized autograft was then impacted  into place and recessed approximately 2 mm from the posterior cortical  margin.  Distractor was removed from the patient's right side.  Once again  thecal sac nerve roots protected on the right side.  Disk space then reamed  and cut with a 8 mm tangent chisel.  Soft tissues then removed from the  interspace.  Disk space further curettaged.  Morselized autograft packed  interspace.  Am 8 x 26 mL tangent wedge was then impacted into place and  recessed approximately 2 mm from the posterior cortical margin.  Pedicles of  L5-S1 were then identified using superficial landmarks and intraoperative  fluoroscopy.  Superficial bone overlying the pedicle was removed using high-  speed drill.  Each pedicle was then probed using  pedicle awl.  Pedicle awl  track was then probed and found to be solid within bone.  Each pedicle awl  track was then tapped with a 5.25 mm screw tap.  Each screw tap hole was  probed and found to be solid within bone.  A 6.75 x 35 mm spiral 90 D screw  was placed bilaterally at L5. 6.75 x 30 mm screws placed bilaterally at S1.  Transverse processes and sacral ala were then decorticated using the high-  speed drill.  Morselized autograft packed posterolaterally for later  arthrodesis.  Short segment titanium rod was then placed through the screw  heads at L5-S1.  Locking caps then placed over screw heads.  Locking caps  then engaged with the construct under compression.  Final images revealed  good position of bone graft and hardware at proper operative level with  normal alignment of the spine.  The wound was then irrigated one final time.  Gelfoam was placed topically for hemostasis.  Medium Hemovac drain was left  __________ .  The wound was then closed in layers with Vicryl suture.  Steri-  Strips and sterile dressing were applied.  There are no complications.  The  patient tolerated procedure well and he returns to recovery room  postoperatively.           ______________________________  Kathaleen Maser Pool, M.D.     HAP/MEDQ  D:  02/15/2006  T:  02/16/2006  Job:  [id number]

## 2010-11-21 NOTE — Op Note (Signed)
NAMECHRISTOPHERJOHN, SCHIELE                          ACCOUNT NO.:  [id number]   MEDICAL RECORD NO.:  [id number]                   PATIENT TYPE:  AMB   LOCATION:  DAY                                  FACILITY:  [facility]   PHYSICIAN:  Jene Every, M.D.                 DATE OF BIRTH:  1961/04/10   DATE OF PROCEDURE:  09/15/2002  DATE OF DISCHARGE:                                 OPERATIVE REPORT   PREOPERATIVE DIAGNOSES:  Impingement syndrome of the left shoulder, early  capsulitis.   POSTOPERATIVE DIAGNOSES:  Impingement syndrome of the left shoulder, early  capsulitis.   PROCEDURE:  Left shoulder arthroscopy, subacromial decompression,  bursectomy, exam under anesthesia.   ASSISTANT:  Roma Schanz, P.A.   BRIEF HISTORY:  A 50 year old with a refractory shoulder pain despite  conservative treatment including course of steroid injection,  antiinflammatory medications and rest. The patient had an MRI which  indicated no evidence of rotator cuff tear; however, with persistent  symptoms. The patient was felt to have an impingement type syndrome  associated with sinus. Operative intervention was indicated for  decompression in the subacromial joint, evaluate and debridement, review of  the rotator cuff, open repair if noted. On exam under anesthesia, he had  early adhesive capsulitis. The risks and benefits were discussed including  bleeding, infection, injury to neurovascular structures, no change in  symptoms, repeat impingement pain, etc.   TECHNIQUE:  The patient supine in beach chair position and after an adequate  level of general anesthesia and 1 gm of Kefzol was placed in the right  lateral decubitus position, all bony prominences well padded. An axial roll  was utilized. The left upper extremity was placed in the 70 and 30 position  with 15 pounds of traction after examination under anesthesia revealed no  significant contracture of the shoulder noted on abduction/adduction  forward  flexion extension. With the arm in traction, the left shoulder and upper  extremities were prepped and draped in the usual sterile fashion. A marker  utilized to delineate the acromion and AC joint. An incision through the  skin only extended posterolateral portal and advanced a blunt trocar through  the glenohumeral joint in line with the coracoid and atraumatically  penetrated the capsule. Irrigant was utilized to insufflate the joint.  Inspection of the joint revealed an essentially normal glenohumeral joint,  no increase in translation. The glenoid and humerus were unremarkable. There  was no evidence of rotator cuff tear. We inspected the anterior and  posterior attachments as well. The biceps was unremarkable. There was no  labral tear. There was no hypertrophic synovium. The camera and stylette was  then redirected into the subacromial position with the arm in the 0-45  position. A standard anterolateral portal incision was made through the skin  and a portal was then advanced under direct visualization. Noted instantly  was hypertrophic bursa throughout the subacromial  joint. The shaver was  introduced and utilized to perform bursectomy taking care not to injure the  rotator cuff. The rotator cuff was examined in its entirety and there was no  evidence of tearing of the rotator cuff. An ArthroWand was introduced and  utilized to detach and morcellize and cauterize the CA ligament and to  divide the anterolateral aspect of the acromion. This provided additional  space in the subacromial joint. This was fully detached. There was no  evidence of a significant acromial spur that would require acromioplasty,  however. Full reinspection of the rotator cuff and a palpation revealed no  evidence of tear. Next, the subacromial joint was copiously lavaged, we used  70 and 75 mmHg throughout the case. The joint was drained and all  instrumentation was removed. The portals were closed  with 4-0 nylon simple  sutures. A 0.25% Marcaine with  epinephrine was infiltrated in the subacromial joint. The patient was placed  in a sling, extubated without difficulty and transported to the recovery  room in satisfactory condition.   The patient tolerated the procedure well with no complications.                                               Jene Every, M.D.    Cordelia Pen  D:  09/15/2002  T:  09/15/2002  Job:  [id number]

## 2010-11-21 NOTE — Cardiovascular Report (Signed)
[place]  Patient:    Steven Edwards, Steven Edwards Visit Number: [id number] MRN: [id number]          Service Type: CAT Location: [facility] 2872 01 Attending Physician:  Corliss Marcus Proc. Date: 08/27/00 Admit Date:  08/27/2000 Discharge Date: 08/27/2000   CC:         Cardiac Catheterization Laboratory  [facility]   Cardiac Catheterization  INDICATIONS:  [name] is a 50 year old man who has atypical angina.  This has been evaluated invasively with an exercise Cardiolite.  The findings were considered low risk for significant CAD.  However, the patient has remained symptomatic despite other measures.  He is brought therefore to the catheterization laboratory to identify the extent of disease and provide for further therapeutic options.  DESCRIPTION OF PROCEDURE:  The left heart catheterization was performed following the percutaneous insertion of a 5 French catheter sheath utilizing an anterior approach over a guiding J wire into the right femoral artery.  A 110 cm pigtail catheter was used to measure pressures in the ascending aorta and in the left ventricle, both prior to and following the ventriculogram. A 30 degree RAO cine left ventriculogram was performed utilizing a power injector.  Coronary angiography was performed using a 5 [place] #4 left and right Judkins catheters.  Cineangiography of each coronary artery was conducted in multiple LAO and RAO projections.  At the completion of the procedure the catheter and sheaths were removed.  Hemostasis was achieved by use of the Perclose system.  There was excellent hemostasis.  The patient was transported to the recovery area in stable condition with intact distal pulse.   HEMODYNAMICS:  Systemic arterial pressure was 102/74 with a mean of 89 mmHg. There was no systolic gradient across the aortic valve.  Left ventricular end-diastolic pressure was 9 mmHg preventriculogram and 11 mmHg  post ventriculogram.  ANGIOGRAPHY:  LEFT VENTRICULOGRAM:  The left ventriculogram demonstrated mild anterolateral hypokinesis.  There was no mitral regurgitation.  The chamber size was normal. There was no coronary calcification.  The calculated ejection fraction utilizing a single plane cine method was 57%.  There was a right dominant coronary system present.  The main left coronary was long and without significant obstruction.  The left anterior descending artery and its branches were without significant obstruction.  There were some luminal irregularities seen in the proximal and midportion.  It gives rise to a small first diagonal and a larger second diagonal branch.  There are no obstructions seen within this vessel or its branches.  Left circumflex artery and its branches are again without significant obstruction.  The vessel gives rise to a large branching ramus intermedius which provides the predominance of flow to the lateral mid and apical portions of the lateral wall.  The ongoing circumflex near the AV groove is small and gives rise to a single basal obtuse marginal branch.  The right coronary artery and its branches are  minimal diseased; again, luminal irregularities are seen within the mid and distal portions of the vessel. It gives rise to a moderate sized posterior descending artery and large posterolateral segment and two left ventricular branches, all of which are without significant obstruction.  Collateral vessels are not seen.  FINAL IMPRESSION: 1. No evidence of obstructive atherosclerotic coronary vascular disease. 2. Noncardiac chest pain. 3. Intact left ventricular size and global systolic function.  PLAN/RECOMMENDATION:  The patient is presented with this gratifying news.  He is not at risk for a coronary event  that would explain his ongoing chest pain. He has not really responded to proton pump inhibitors making the likelihood of GERD more remote.   He will be given a trial of antiinflammatory drugs for a possible "pleurisy" or musculoskeletal chest wall pain. Attending Physician:  Corliss Marcus DD:  08/27/00 TD:  08/28/00 Job: [id number]

## 2011-01-19 ENCOUNTER — Encounter (HOSPITAL_COMMUNITY): Payer: Self-pay

## 2011-01-19 ENCOUNTER — Ambulatory Visit (HOSPITAL_COMMUNITY)
Admission: RE | Admit: 2011-01-19 | Discharge: 2011-01-19 | Disposition: A | Discharge status: Home / Self Care | Payer: 59 | Source: Ambulatory Visit | Attending: Cardiology | Admitting: Cardiology

## 2011-01-19 DIAGNOSIS — R0602 Shortness of breath: Secondary | ICD-10-CM | POA: Insufficient documentation

## 2011-01-27 ENCOUNTER — Other Ambulatory Visit: Payer: Self-pay | Admitting: Internal Medicine

## 2011-01-27 ENCOUNTER — Ambulatory Visit
Admission: RE | Admit: 2011-01-27 | Discharge: 2011-01-27 | Disposition: A | Discharge status: Home / Self Care | Payer: 59 | Source: Ambulatory Visit | Attending: Internal Medicine | Admitting: Internal Medicine

## 2011-01-27 DIAGNOSIS — R609 Edema, unspecified: Secondary | ICD-10-CM

## 2011-02-03 ENCOUNTER — Inpatient Hospital Stay (HOSPITAL_BASED_OUTPATIENT_CLINIC_OR_DEPARTMENT_OTHER)
Admission: RE | Admit: 2011-02-03 | Discharge: 2011-02-03 | Disposition: A | Discharge status: Home / Self Care | Payer: 59 | Source: Ambulatory Visit | Attending: Cardiology | Admitting: Cardiology

## 2011-02-03 DIAGNOSIS — R0789 Other chest pain: Secondary | ICD-10-CM | POA: Insufficient documentation

## 2011-02-03 DIAGNOSIS — R0602 Shortness of breath: Secondary | ICD-10-CM | POA: Insufficient documentation

## 2011-02-03 LAB — POCT I-STAT 4, (NA,K, GLUC, HGB,HCT)
Glucose, Bld: 95 mg/dL (ref 70–99)
HCT: 53 % — ABNORMAL HIGH (ref 39.0–52.0)

## 2011-02-05 NOTE — Cardiovascular Report (Signed)
Steven, Edwards              ACCOUNT NO.:  [id number]  MEDICAL RECORD NO.:  [id number]  LOCATION:                                 FACILITY:  PHYSICIAN:  Armanda Magic, M.D.     DATE OF BIRTH:  [birth year]  DATE OF PROCEDURE:  02/03/2011 DATE OF DISCHARGE:                           CARDIAC CATHETERIZATION   REFERRING PHYSICIAN:  Georgann Housekeeper, MD  PROCEDURE:  Left heart catheterization, coronary angiography, left ventriculography.  OPERATOR:  Armanda Magic, MD  INDICATIONS:  Chest pain and shortness of breath.  COMPLICATIONS:  None.  IV ACCESS:  Via right femoral artery 4-French sheath.  IV MEDICATIONS:  Versed 1 mg, fentanyl 25 mcg.  PROCEDURE IN DETAIL:  This is a 50 year old male with a history of hypertension, dyslipidemia as well as sleep apnea who presented with episodes of chest pain and shortness of breath.  Nuclear stress test was normal and 2-D echocardiogram was normal.  He had PFTs which were normal as well.  He now presents for cardiac catheterization.  The patient was brought to cardiac catheterization laboratory in a fasting nonsedated state.  Informed consent was obtained.  The patient was connected to continuous heart rate, pulse oximetry monitoring, and intermittent blood pressure monitoring.  The right groin was prepped and draped in sterile fashion and 1% Xylocaine was also used for local anesthesia.  Using modified Seldinger technique, a 4-French sheath was placed in the right femoral artery.  Under fluoroscopic guidance, a 4- [place] JL-4 catheter was placed in left coronary artery.  The catheter could not engage the coronary ostium adequately and it was exchanged out over a guidewire for a 4-French JL-5 catheter which again could not engage the coronary ostium adequately.  The catheter was exchanged out over a guidewire for a 4-French JL 3.5 catheter which successfully engaged the left coronary ostium.  Multiple cine films were taken at  30 degree RAO, 40 degree LAO views.  This catheter was then exchanged out over a guidewire for a 4-French 3DRC catheter which successfully engaged the right coronary ostium.  Multiple cine films were taken at 30 degree RAO, 40 degree LAO views.  This catheter was then exchanged out over a guidewire for a 4-French angled pigtail catheter which was placed under fluoroscopic guidance in the left ventricular cavity.  Left ventriculography was performed in 30 degrees RAO view using total of 25 mL of contrast at 12 mL per second.  The catheter was then pulled back across the aortic valve with no significant gradient noted.  At the end of procedure, all catheters and sheaths were removed.  Manual compression was performed until adequate hemostasis was obtained.  The patient was transferred back to room in stable condition.  RESULTS: 1. Left main coronary artery is widely patent and trifurcates to left     anterior descending artery, left circumflex artery, and ramus     branch. 2. Left anterior descending artery is widely patent throughout the     course of the apex.  It gives rise to a first diagonal which is     moderate size and is widely patent and bifurcates into 2 daughter     vessels both of  which are widely patent.  It gives rise to 2     smaller diagonal 2 and diagonal 3 branches which are widely patent.  The left circumflex is moderate in size and traverses AV groove but quickly tapers distally.  It is widely patent.  It gives rise to 2 small obtuse marginal branches, both of which are widely patent.  The ramus is moderate size and is widely patent throughout its course. It bifurcates into 2 daughter vessels, both of which are widely patent.  The right coronary artery is widely patent throughout its course and distally bifurcates into posterior descending artery and posterior lateral artery both of which are widely patent.  Left ventriculography shows normal LV function, EF 60%,  LV pressure is 108/9 mmHg with LVEDP of 15 mmHg, aortic pressure was 106/74 mmHg.  ASSESSMENT: 1. Normal coronary arteries. 2. Normal left ventricular function. 3. Noncardiac chest pain.  PLAN:  Discharged home after IV fluid and bedrest. We will have the patient follow up with primary physician for further workup of noncardiac chest pain and shortness of breath.     Armanda Magic, M.D.     TT/MEDQ  D:  02/03/2011  T:  02/03/2011  Job:  [id number]  cc:   Georgann Housekeeper, MD  Electronically Signed by Armanda Magic M.D. on 02/05/2011 08:53:33 AM

## 2011-03-04 ENCOUNTER — Ambulatory Visit (HOSPITAL_BASED_OUTPATIENT_CLINIC_OR_DEPARTMENT_OTHER)
Admission: RE | Admit: 2011-03-04 | Discharge: 2011-03-04 | Disposition: A | Discharge status: Home / Self Care | Payer: 59 | Source: Ambulatory Visit | Attending: Urology | Admitting: Urology

## 2011-03-04 DIAGNOSIS — K219 Gastro-esophageal reflux disease without esophagitis: Secondary | ICD-10-CM | POA: Insufficient documentation

## 2011-03-04 DIAGNOSIS — Z01812 Encounter for preprocedural laboratory examination: Secondary | ICD-10-CM | POA: Insufficient documentation

## 2011-03-04 DIAGNOSIS — I10 Essential (primary) hypertension: Secondary | ICD-10-CM | POA: Insufficient documentation

## 2011-03-04 DIAGNOSIS — G4733 Obstructive sleep apnea (adult) (pediatric): Secondary | ICD-10-CM | POA: Insufficient documentation

## 2011-03-04 DIAGNOSIS — N432 Other hydrocele: Secondary | ICD-10-CM | POA: Insufficient documentation

## 2011-03-04 DIAGNOSIS — Z79899 Other long term (current) drug therapy: Secondary | ICD-10-CM | POA: Insufficient documentation

## 2011-03-04 LAB — POCT I-STAT 4, (NA,K, GLUC, HGB,HCT)
Glucose, Bld: 97 mg/dL (ref 70–99)
HCT: 50 % (ref 39.0–52.0)
Sodium: 139 mEq/L (ref 135–145)

## 2011-03-10 NOTE — Op Note (Addendum)
NAMEDYQUAN, MINKS NO.:  [id number]  MEDICAL RECORD NO.:  [id number]  LOCATION:                               FACILITY:  [facility]  PHYSICIAN:  Natalia Leatherwood, MD    DATE OF BIRTH:  October 08, 1960  DATE OF PROCEDURE:  03/04/2011 DATE OF DISCHARGE:                              OPERATIVE REPORT   PREOPERATIVE DIAGNOSIS:  Left hydrocele.  POSTOPERATIVE DIAGNOSIS:  Left hydrocele.  PROCEDURE PERFORMED:  Left hydrocelectomy.  COMPLICATIONS:  None.  BLOOD LOSS:  Minimal.  SPECIMEN:  None.  Hydrocele sac was discarded.  FINDINGS:  Large left hydrocele that was not communicating and a closed internal inguinal ring.  HISTORY OF PRESENT ILLNESS:  A pleasant 50 year old gentleman, who has a left hydrocele that was very large and uncomfortable preventing him from voiding normally and participating sexual activity.  After discussion of risks and benefits, patient elected for open repair.  PROCEDURE:  After informed consent was obtained, the patient was taken to the operating room, where he was placed in supine position.  IV antibiotics were infused and general anesthesia was induced.  The hair was removed from his anterior scrotum and around his penis.  Following this, his genitals were prepped and draped in usual sterile fashion. Following this, an incision was made with #15 blade along the anterior- inferior portion of the median raphe.  After this was done, Bovie electrocautery was used to dissect down to the left scrotal contents. The hydrocele sac was immediately available and was able to be delivered out of the scrotum and the layers of the hydrocele were dissected out carefully with Bovie electrocautery to maintain hemostasis as we went. After this was done adequately around the hydrocele sac, a nick was made in the sac and fluid was suctioned out and there was greater than 800 cc of fluid in the hydrocele sac.  Following this, the testicle was delivered,  identified, and all the testicular structures including the spermatic cord, epididymis, and vas deferens were all identified.  The redundancy of the sac was removed with Bovie electrocautery and that was discarded.  The processus vaginalis was evaluated and it was found to be closed.  The edges of the resection of the sac were then cauterized with Bovie electrocautery and then oversewn with a running locking 4-0 chromic suture.  After this was done, the inside of the scrotum was evaluated and Bovie electrocautery was used for hemostasis and then inside of the hydrocele area and scrotum were sprayed with spray thrombin.  After this was done, the testicle was placed back in an anatomic position and a Penrose drain was placed in the intrascrotal portion of the left hemiscrotum, making a separate stab wound at the lateral side of the scrotum.  This Penrose was then tied and placed with a chromic suture.  Following this, the dartos was closed in 2 layers with a running locking 2-0 Vicryl and a running locking 4-0 Vicryl.  The wound was then irrigated with sterile NS.  The skin was closed with a running locking 4-0 chromic suture.  This completed the procedure.  Bacitracin ointment was placed on the wound and then a Kerlix gauze was left  in place on the incision and over the drain and then Jockstrap was placed on the patient. Anesthesia was reversed and he was taken to the PACU in stable condition.  Patient will try and see me in 1 week for drain removal.  He will receive pain medications and p.o. antibiotics for this.          ______________________________ Natalia Leatherwood, MD     DW/MEDQ  D:  03/04/2011  T:  03/05/2011  Job:  [id number]  Electronically Signed by Natalia Leatherwood MD on 03/10/2011 07:18:49 PM

## 2011-04-03 LAB — CBC
MCHC: 34.1
MCV: 93.7
RBC: 5.05
RDW: 13.1
WBC: 5.5

## 2011-04-03 LAB — DIFFERENTIAL
Eosinophils Absolute: 0.1
Eosinophils Relative: 2
Lymphocytes Relative: 18
Lymphs Abs: 1
Monocytes Absolute: 0.5
Monocytes Relative: 8

## 2011-04-03 LAB — COMPREHENSIVE METABOLIC PANEL
ALT: 116 — ABNORMAL HIGH
AST: 52 — ABNORMAL HIGH
BUN: 13
CO2: 29
Calcium: 10.5
Chloride: 101
GFR calc Af Amer: >60
GFR calc non Af Amer: >60
Sodium: 139
Total Bilirubin: 1.2

## 2012-02-02 ENCOUNTER — Other Ambulatory Visit: Payer: Self-pay | Admitting: Internal Medicine

## 2012-02-02 DIAGNOSIS — M549 Dorsalgia, unspecified: Secondary | ICD-10-CM

## 2012-02-06 ENCOUNTER — Ambulatory Visit
Admission: RE | Admit: 2012-02-06 | Discharge: 2012-02-06 | Disposition: A | Discharge status: Home / Self Care | Payer: 59 | Source: Ambulatory Visit | Attending: Internal Medicine | Admitting: Internal Medicine

## 2012-02-06 DIAGNOSIS — M549 Dorsalgia, unspecified: Secondary | ICD-10-CM

## 2013-11-30 ENCOUNTER — Other Ambulatory Visit: Payer: Self-pay | Admitting: Gastroenterology

## 2014-11-14 ENCOUNTER — Other Ambulatory Visit: Payer: Self-pay | Admitting: Dermatology

## 2015-02-26 ENCOUNTER — Other Ambulatory Visit: Payer: Self-pay | Admitting: Dermatology

## 2015-06-13 ENCOUNTER — Ambulatory Visit: Payer: Self-pay | Admitting: Dietician

## 2015-07-11 ENCOUNTER — Ambulatory Visit: Payer: Self-pay | Admitting: Skilled Nursing Facility1

## 2015-08-06 ENCOUNTER — Encounter: Payer: Self-pay | Attending: Internal Medicine | Admitting: Skilled Nursing Facility1

## 2015-08-06 ENCOUNTER — Encounter: Payer: Self-pay | Admitting: Skilled Nursing Facility1

## 2015-08-06 DIAGNOSIS — Z713 Dietary counseling and surveillance: Secondary | ICD-10-CM | POA: Insufficient documentation

## 2015-08-06 DIAGNOSIS — Z6838 Body mass index (BMI) 38.0-38.9, adult: Secondary | ICD-10-CM | POA: Insufficient documentation

## 2015-08-06 NOTE — Progress Notes (Signed)
  Medical Nutrition Therapy:  Appt start time: 11:00 end time:  11:45.   Assessment:  Primary concerns today: referred for obesity Pt states he would like to lose weight. Pts diet hx: phentermine: on and off over a year; no substantial wt loss, HCG: lost 10 pounds in 2-3 months as soon as stopped gained the wt back: has tried it a few times but with no wt. Pt states he is usually around 220 pounds. Pt states his cholesterol has been high with a family hx. Pt states he sleeps about 8 hours a night with his C-PAP machine. Pt states he has had 2 back surgeries. Pt states his friend lost wt via the tools learned in this appointment so he is confident he can do it too. Pt states he just has to get his wife on board with the changes.  Preferred Learning Style:   No preference indicated   Learning Readiness:   Contemplating   MEDICATIONS: See List   DIETARY INTAKE:  Usual eating pattern includes 1 meals and 2 snacks per day.  Everyday foods include none stated.  Avoided foods include none stated.    24-hr recall:  B ( AM): banana  Snk ( AM): none  L ( PM): none-----vending machine----fast food Snk ( PM): none D ( PM): out Snk ( PM): none Beverages: juice, 3-4 alcoholic beverages, water, sweet tea  *meals outside the home: 7+  Usual physical activity: ADL's  Estimated energy needs: 2000 calories 225 g carbohydrates 150 g protein 56 g fat  Progress Towards Goal(s):  In progress.   Nutritional Diagnosis:  NB-1.1 Food and nutrition-related knowledge deficit As related to no prior nutrition education from a nutrition professional.  As evidenced by pt report, pts beleif he needed a wt loss pill, and BMI 38.90.    Intervention:  Nutrition counseling for obesity. Dietitian educated the pt on balanced meals, meals outside the home contribution to health, eating throughout the day, alcohol contribution to wt, the importance of planning, and the importance of physical  activity. Goals: -Cut your eating out to once a week -Limit your alcohol to 2 drinks a day -Try to be physically active 5 days a week -Plan your meals and snacks, grocery shop more often and get enough for 3 meals a day and 2-3 snacks -Try to limit your juice to 4 ounces a day -A meal: carbohydrate, protein, vegetables -A snack: carbohydrate and protein OR vegetable and protein  Teaching Method Utilized:  Visual Auditory  Handouts given during visit include:  Snack sheet  MyPlate  Barriers to learning/adherence to lifestyle change: none identified  Demonstrated degree of understanding via:  Teach Back   Monitoring/Evaluation:  Dietary intake, exercise, cholesterol, and body weight prn. \

## 2015-08-06 NOTE — Patient Instructions (Signed)
-  Cut your eating out to once a week -Limit your alcohol to 2 drinks a day -Try to be physically active 5 days a week -Plan your meals and snacks, grocery shop more often and get enough for 3 meals a day and 2-3 snacks -Try to limit your juice to 4 ounces a day -A meal: carbohydrate, protein, vegetables -A snack: carbohydrate and protein OR vegetable and protein

## 2015-10-16 ENCOUNTER — Other Ambulatory Visit: Payer: Self-pay | Admitting: Neurosurgery

## 2015-10-16 DIAGNOSIS — M5124 Other intervertebral disc displacement, thoracic region: Secondary | ICD-10-CM

## 2015-10-24 ENCOUNTER — Ambulatory Visit
Admission: RE | Admit: 2015-10-24 | Discharge: 2015-10-24 | Disposition: A | Discharge status: Home / Self Care | Payer: 59 | Source: Ambulatory Visit | Attending: Neurosurgery | Admitting: Neurosurgery

## 2015-10-24 DIAGNOSIS — M5124 Other intervertebral disc displacement, thoracic region: Secondary | ICD-10-CM

## 2016-07-09 ENCOUNTER — Encounter: Payer: 59 | Attending: Internal Medicine | Admitting: Dietician

## 2016-07-09 ENCOUNTER — Encounter: Payer: Self-pay | Admitting: Dietician

## 2016-07-09 DIAGNOSIS — E119 Type 2 diabetes mellitus without complications: Secondary | ICD-10-CM | POA: Diagnosis present

## 2016-07-09 DIAGNOSIS — I10 Essential (primary) hypertension: Secondary | ICD-10-CM | POA: Insufficient documentation

## 2016-07-09 DIAGNOSIS — E785 Hyperlipidemia, unspecified: Secondary | ICD-10-CM | POA: Insufficient documentation

## 2016-07-09 DIAGNOSIS — Z713 Dietary counseling and surveillance: Secondary | ICD-10-CM | POA: Insufficient documentation

## 2016-07-09 NOTE — Progress Notes (Signed)
Diabetes Self-Management Education  Visit Type: First/Initial  Appt. Start Time: 1400 Appt. End Time: [id number]  07/09/2016  [name], identified by name and date of birth, is a 56 y.o. male with a diagnosis of Diabetes: (P) Type 2.  Patient has history of hypertension and hyperlipidemia.  Previously known to our center for obesity/ weight management counseling.  He brings in a glucometer today requesting training for self monitoring of blood glucose.  ASSESSMENT      Diabetes Self-Management Education - 07/09/16 1414      Visit Information   Visit Type First/Initial     Initial Visit   Diabetes Type Type 2   Are you currently following a meal plan? No   Are you taking your medications as prescribed? Yes   Date Diagnosed Dec 0000000     Complications   Last HgB A1C per patient/outside source 11.7 %   How often do you check your blood sugar? 0 times/day (not testing), owns glucometer, instructed to test 1x/day for fasting BG     Dietary Intake   Breakfast banana, glass of OJ (1/2 to 1 cup)   Snack (morning) black coffee at work   [employer] of peanuts or other vending machine snack, water   Dinner out to eat for red meat or fish, salad, water   Snack (evening) peanut butter and crackers   Beverage(s) water, 1/2 to 1 cup of OJ per day, occassionally alcohol on weekend     Exercise   Exercise Type Moderate (stationary bike at home, walking)   How many days per week to you exercise? 2   How many minutes per day do you exercise? 30   Total minutes per week of exercise 60     Patient Education   Previous Diabetes Education No   Disease state  Definition of diabetes, type 1 and 2, and the diagnosis of diabetes   Nutrition management  Role of diet in the treatment of diabetes and the relationship between the three main macronutrients and blood glucose level;Food label reading, portion sizes and measuring food.;Carbohydrate counting;Reviewed blood glucose goals for pre and post  meals and how to evaluate the patients' food intake on their blood glucose level.;Meal timing in regards to the patients' current diabetes medication.;Effects of alcohol on blood glucose and safety factors with consumption of alcohol.;Information on hints to eating out and maintain blood glucose control.;Meal options for control of blood glucose level and chronic complications.   Physical activity and exercise  Role of exercise on diabetes management, blood pressure control and cardiac health.   Monitoring Taught/evaluated SMBG meter.;Purpose and frequency of SMBG.;Taught/discussed recording of test results and interpretation of SMBG.;Identified appropriate SMBG and/or A1C goals.;Yearly dilated eye exam   Acute complications Taught treatment of hypoglycemia - the 15 rule.;Discussed and identified patients' treatment of hyperglycemia.   Chronic complications  Relationship between chronic complications and blood glucose control   Psychosocial adjustment Role of stress on diabetes     Outcomes   Program Status Completed      Individualized Plan for Diabetes Self-Management Training:   Learning Objective:  Patient will have a greater understanding of diabetes self-management. Patient education plan is to attend individual and/or group sessions per assessed needs and concerns.   Plan:    Follow Diabetes Meal Plan as instructed  Eat 3 meals and 2 snacks, every 3-5 hrs  Limit carbohydrate intake to 45-60 grams carbohydrate/meal  Limit carbohydrate intake to 15-30 grams carbohydrate/snack  Add lean protein foods  to meals/snacks  Monitor glucose levels as instructed by your doctor  Aim for 30 mins of physical activity daily (150-250 min per week)  Expected Outcomes:  positive  Education material provided: Living Well with Diabetes, A1C conversion sheet, Meal plan card, My Plate and Snack sheet  If problems or questions, patient to contact team via:  Phone and Email  Future DSME  appointment: prn

## 2017-05-26 ENCOUNTER — Ambulatory Visit: Payer: Self-pay | Admitting: Podiatry

## 2017-06-09 ENCOUNTER — Encounter: Payer: Self-pay | Admitting: Podiatry

## 2017-06-09 ENCOUNTER — Ambulatory Visit: Payer: 59 | Admitting: Podiatry

## 2017-06-09 DIAGNOSIS — L603 Nail dystrophy: Secondary | ICD-10-CM | POA: Diagnosis not present

## 2017-06-09 NOTE — Patient Instructions (Signed)

## 2017-06-09 NOTE — Progress Notes (Signed)
   Subjective:    Patient ID: Steven Edwards, male    DOB: April 25, 1961, 56 y.o.   MRN: [id number]  HPI    Review of Systems  All other systems reviewed and are negative.      Objective:   Physical Exam        Assessment & Plan:

## 2017-06-13 NOTE — Progress Notes (Signed)
   Subjective: 56 year old male with past medical history of type 2 diabetes presenting today as a new patient for possible treatment and evaluation of fungus of the right great toe that has been ongoing for several weeks.  He reports associated discoloration and thickening of the nail.  He was referred here by his PCP to have the nail removed.  He has not received any treatment for the symptoms.  There are no modifying factors noted.  He denies pain.  Patient presents today for further treatment and evaluation.    Past Medical History:  Diagnosis Date  . Apnea   . Hyperlipidemia   . Hypertension   . Sleep apnea     Objective: Physical Exam General: The patient is alert and oriented x3 in no acute distress.  Dermatology: Hyperkeratotic, discolored, thickened, onychodystrophy of the right great toenail. Skin is warm, dry and supple bilateral lower extremities. Negative for open lesions or macerations.  Vascular: Palpable pedal pulses bilaterally. No edema or erythema noted. Capillary refill within normal limits.  Neurological: Epicritic and protective threshold grossly intact bilaterally.   Musculoskeletal Exam: Range of motion within normal limits to all pedal and ankle joints bilateral. Muscle strength 5/5 in all groups bilateral.   Assessment: #1 Dystrophic nail right great toe  Plan of Care:  1. Patient was evaluated. 2. Discussed treatment alternatives and plan of care. Explained nail avulsion procedure and post procedure course to patient. 3. Patient opted for total permanent nail avulsion.  4. Prior to procedure, local anesthesia infiltration utilized using 3 ml of a 50:50 mixture of 2% plain lidocaine and 0.5% plain marcaine in a normal hallux block fashion and a betadine prep performed.  5. Partial permanent nail avulsion with chemical matrixectomy performed using [id number] applications of phenol followed by alcohol flush.  6. Light dressing applied. 7. Return to clinic in 2  weeks.   Edrick Kins, DPM [facility]  Dr. Edrick Kins, [address]                                        [place], [place] [address]                Office [phone]  Fax [phone]

## 2017-06-23 ENCOUNTER — Ambulatory Visit: Payer: 59 | Admitting: Podiatry

## 2017-09-15 ENCOUNTER — Other Ambulatory Visit: Payer: Self-pay | Admitting: Dermatology

## 2017-10-19 ENCOUNTER — Encounter: Payer: Self-pay | Admitting: *Deleted

## 2017-10-19 NOTE — Telephone Encounter (Signed)
This encounter was created in error - please disregard.

## 2018-06-13 ENCOUNTER — Ambulatory Visit: Payer: Self-pay | Admitting: Family Medicine

## 2018-06-13 VITALS — BP 140/72 | HR 89 | Temp 98.6°F

## 2018-06-13 DIAGNOSIS — H6693 Otitis media, unspecified, bilateral: Secondary | ICD-10-CM

## 2018-06-13 DIAGNOSIS — J019 Acute sinusitis, unspecified: Secondary | ICD-10-CM

## 2018-06-13 MED ORDER — PSEUDOEPH-BROMPHEN-DM 30-2-10 MG/5ML PO SYRP: 10.0000 mL | ORAL_SOLUTION | Freq: Three times a day (TID) | ORAL | 0 refills | Status: DC | PRN

## 2018-06-13 MED ORDER — AZELASTINE HCL 0.1 % NA SOLN: 1.0000 | Freq: Two times a day (BID) | NASAL | 0 refills | Status: DC

## 2018-06-13 MED ORDER — PSEUDOEPH-BROMPHEN-DM 30-2-10 MG/5ML PO SYRP
10.0000 mL | ORAL_SOLUTION | Freq: Three times a day (TID) | ORAL | 0 refills | Status: AC | PRN
Start: 2018-06-13 — End: ?

## 2018-06-13 MED ORDER — AZELASTINE HCL 0.1 % NA SOLN: 1.0000 | Freq: Two times a day (BID) | NASAL | 0 refills | Status: AC

## 2018-06-13 MED ORDER — AMOXICILLIN-POT CLAVULANATE 875-125 MG PO TABS: 1.0000 | ORAL_TABLET | Freq: Two times a day (BID) | ORAL | 0 refills | Status: DC

## 2018-06-13 MED ORDER — AMOXICILLIN-POT CLAVULANATE 875-125 MG PO TABS: 1.0000 | ORAL_TABLET | Freq: Two times a day (BID) | ORAL | 0 refills | Status: AC

## 2018-06-13 NOTE — Progress Notes (Signed)
Steven Edwards is a 57 y.o. male who presents today with concerns of sinus pressure and congestion for the last week. He has been treated electronically through his insurance of [facility] virtual visit. He reports originally taking pseudoefed for congestion and then calling back in 48 hours and being prescribed amoxicillin. He reports that after 3 days of treatment he denies any improvement in symptoms.  Review of Systems  Constitutional: Negative for chills, fever and malaise/fatigue.  HENT: Positive for congestion and ear pain. Negative for ear discharge, sinus pain and sore throat.   Eyes: Negative.   Respiratory: Positive for cough. Negative for sputum production and shortness of breath.   Cardiovascular: Negative.  Negative for chest pain.  Gastrointestinal: Negative for abdominal pain, diarrhea, nausea and vomiting.  Genitourinary: Negative for dysuria, frequency, hematuria and urgency.  Musculoskeletal: Negative for myalgias.  Skin: Negative.   Neurological: Negative for headaches.  Endo/Heme/Allergies: Negative.   Psychiatric/Behavioral: Negative.     O: Vitals:   06/13/18 1348  BP: 140/72  Pulse: 89  Temp: 98.6 F (37 C)     Physical Exam  Constitutional: He is oriented to person, place, and time. Vital signs are normal. He appears well-developed and well-nourished. He is active.  Non-toxic appearance. He does not have a sickly appearance.  HENT:  Head: Normocephalic.  Right Ear: Hearing, external ear and ear canal normal. Tympanic membrane is erythematous. A middle ear effusion is present.  Left Ear: Hearing, external ear and ear canal normal. Tympanic membrane is erythematous. A middle ear effusion is present.  Nose: Nose normal.  Mouth/Throat: Uvula is midline and oropharynx is clear and moist.  Mild TM erythema particularly on the left ear with- bilateral ear wax that is soft and fluid patient reports using drops- bilateral ear flush- patient tolerated well.  TM visualized-abnormal  Neck: Normal range of motion. Neck supple.  Cardiovascular: Normal rate, regular rhythm, normal heart sounds and normal pulses.  Pulmonary/Chest: Effort normal and breath sounds normal.  Abdominal: Soft. Bowel sounds are normal.  Musculoskeletal: Normal range of motion.  Lymphadenopathy:       Head (right side): No submental and no submandibular adenopathy present.       Head (left side): No submental and no submandibular adenopathy present.    He has no cervical adenopathy.  Neurological: He is alert and oriented to person, place, and time.  Psychiatric: He has a normal mood and affect.  Vitals reviewed.  A: 1. Bilateral otitis media, unspecified otitis media type   2. Acute non-recurrent sinusitis, unspecified location    P: Discussed exam findings, diagnosis etiology and medication use and indications reviewed with patient. Follow- Up and discharge instructions provided. No emergent/urgent issues found on exam.  Patient verbalized understanding of information provided and agrees with plan of care (POC), all questions answered.  1. Bilateral otitis media, unspecified otitis media type - amoxicillin-clavulanate (AUGMENTIN) 875-125 MG tablet; Take 1 tablet by mouth 2 (two) times daily for 7 days.  2. Acute non-recurrent sinusitis, unspecified location - amoxicillin-clavulanate (AUGMENTIN) 875-125 MG tablet; Take 1 tablet by mouth 2 (two) times daily for 7 days. - brompheniramine-pseudoephedrine-DM 30-2-10 MG/5ML syrup; Take 10 mLs by mouth 3 (three) times daily as needed. - azelastine (ASTELIN) 0.1 % nasal spray; Place 1 spray into both nostrils 2 (two) times daily. Use in each nostril as directed

## 2018-06-13 NOTE — Patient Instructions (Signed)
Sinusitis, Adult Sinusitis is soreness and inflammation of your sinuses. Sinuses are hollow spaces in the bones around your face. Your sinuses are located:  Around your eyes.  In the middle of your forehead.  Behind your nose.  In your cheekbones.  Your sinuses and nasal passages are lined with a stringy fluid (mucus). Mucus normally drains out of your sinuses. When your nasal tissues become inflamed or swollen, the mucus can become trapped or blocked so air cannot flow through your sinuses. This allows bacteria, viruses, and funguses to grow, which leads to infection. Sinusitis can develop quickly and last for 7?10 days (acute) or for more than 12 weeks (chronic). Sinusitis often develops after a cold. What are the causes? This condition is caused by anything that creates swelling in the sinuses or stops mucus from draining, including:  Allergies.  Asthma.  Bacterial or viral infection.  Abnormally shaped bones between the nasal passages.  Nasal growths that contain mucus (nasal polyps).  Narrow sinus openings.  Pollutants, such as chemicals or irritants in the air.  A foreign object stuck in the nose.  A fungal infection. This is rare.  What increases the risk? The following factors may make you more likely to develop this condition:  Having allergies or asthma.  Having had a recent cold or respiratory tract infection.  Having structural deformities or blockages in your nose or sinuses.  Having a weak immune system.  Doing a lot of swimming or diving.  Overusing nasal sprays.  Smoking.  What are the signs or symptoms? The main symptoms of this condition are pain and a feeling of pressure around the affected sinuses. Other symptoms include:  Upper toothache.  Earache.  Headache.  Bad breath.  Decreased sense of smell and taste.  A cough that may get worse at night.  Fatigue.  Fever.  Thick drainage from your nose. The drainage is often green and  it may contain pus (purulent).  Stuffy nose or congestion.  Postnasal drip. This is when extra mucus collects in the throat or back of the nose.  Swelling and warmth over the affected sinuses.  Sore throat.  Sensitivity to light.  How is this diagnosed? This condition is diagnosed based on symptoms, a medical history, and a physical exam. To find out if your condition is acute or chronic, your health care provider may:  Look in your nose for signs of nasal polyps.  Tap over the affected sinus to check for signs of infection.  View the inside of your sinuses using an imaging device that has a light attached (endoscope).  If your health care provider suspects that you have chronic sinusitis, you may also:  Be tested for allergies.  Have a sample of mucus taken from your nose (nasal culture) and checked for bacteria.  Have a mucus sample examined to see if your sinusitis is related to an allergy.  If your sinusitis does not respond to treatment and it lasts longer than 8 weeks, you may have an MRI or CT scan to check your sinuses. These scans also help to determine how severe your infection is. In rare cases, a bone biopsy may be done to rule out more serious types of fungal sinus disease. How is this treated? Treatment for sinusitis depends on the cause and whether your condition is chronic or acute. If a virus is causing your sinusitis, your symptoms will go away on their own within 10 days. You may be given medicines to relieve your symptoms,   including:  Topical nasal decongestants. They shrink swollen nasal passages and let mucus drain from your sinuses.  Antihistamines. These drugs block inflammation that is triggered by allergies. This can help to ease swelling in your nose and sinuses.  Topical nasal corticosteroids. These are nasal sprays that ease inflammation and swelling in your nose and sinuses.  Nasal saline washes. These rinses can help to get rid of thick mucus in  your nose.  If your condition is caused by bacteria, you will be given an antibiotic medicine. If your condition is caused by a fungus, you will be given an antifungal medicine. Surgery may be needed to correct underlying conditions, such as narrow nasal passages. Surgery may also be needed to remove polyps. Follow these instructions at home: Medicines  Take, use, or apply over-the-counter and prescription medicines only as told by your health care provider. These may include nasal sprays.  If you were prescribed an antibiotic medicine, take it as told by your health care provider. Do not stop taking the antibiotic even if you start to feel better. Hydrate and Humidify  Drink enough water to keep your urine clear or pale yellow. Staying hydrated will help to thin your mucus.  Use a cool mist humidifier to keep the humidity level in your home above 50%.  Inhale steam for 10-15 minutes, 3-4 times a day or as told by your health care provider. You can do this in the bathroom while a hot shower is running.  Limit your exposure to cool or dry air. Rest  Rest as much as possible.  Sleep with your head raised (elevated).  Make sure to get enough sleep each night. General instructions  Apply a warm, moist washcloth to your face 3-4 times a day or as told by your health care provider. This will help with discomfort.  Wash your hands often with soap and water to reduce your exposure to viruses and other germs. If soap and water are not available, use hand sanitizer.  Do not smoke. Avoid being around people who are smoking (secondhand smoke).  Keep all follow-up visits as told by your health care provider. This is important. Contact a health care provider if:  You have a fever.  Your symptoms get worse.  Your symptoms do not improve within 10 days. Get help right away if:  You have a severe headache.  You have persistent vomiting.  You have pain or swelling around your face or  eyes.  You have vision problems.  You develop confusion.  Your neck is stiff.  You have trouble breathing. This information is not intended to replace advice given to you by your health care provider. Make sure you discuss any questions you have with your health care provider. Document Released: 06/22/2005 Document Revised: 02/16/2016 Document Reviewed: 04/17/2015 [employer] Interactive Patient Education  2018 [employer]. Otitis Media, Adult Otitis media is redness, soreness, and puffiness (swelling) in the space just behind your eardrum (middle ear). It may be caused by allergies or infection. It often happens along with a cold. Follow these instructions at home:  Take your medicine as told. Finish it even if you start to feel better.  Only take over-the-counter or prescription medicines for pain, discomfort, or fever as told by your doctor.  Follow up with your doctor as told. Contact a doctor if:  You have otitis media only in one ear, or bleeding from your nose, or both.  You notice a lump on your neck.  You are not  getting better in 3-5 days.  You feel worse instead of better. Get help right away if:  You have pain that is not helped with medicine.  You have puffiness, redness, or pain around your ear.  You get a stiff neck.  You cannot move part of your face (paralysis).  You notice that the bone behind your ear hurts when you touch it. This information is not intended to replace advice given to you by your health care provider. Make sure you discuss any questions you have with your health care provider. Document Released: 12/09/2007 Document Revised: 11/28/2015 Document Reviewed: 01/17/2013 Elsevier Interactive Patient Education  2017 [employer].

## 2018-06-15 ENCOUNTER — Telehealth: Payer: Self-pay

## 2018-06-15 NOTE — Telephone Encounter (Signed)
Patient did not answered the phone. I asked to call us back.
